# Patient Record
Sex: Female | Born: 1955 | Race: White | Hispanic: No | Marital: Married | Smoking: Never smoker
Health system: Southern US, Community
[De-identification: ages and names within clinical notes are randomized; demographics above are authoritative.]

## PROBLEM LIST (undated history)

## (undated) DIAGNOSIS — F419 Anxiety disorder, unspecified: Secondary | ICD-10-CM

## (undated) DIAGNOSIS — F32A Depression, unspecified: Secondary | ICD-10-CM

## (undated) DIAGNOSIS — I10 Essential (primary) hypertension: Secondary | ICD-10-CM

## (undated) DIAGNOSIS — K219 Gastro-esophageal reflux disease without esophagitis: Secondary | ICD-10-CM

## (undated) DIAGNOSIS — E119 Type 2 diabetes mellitus without complications: Secondary | ICD-10-CM

## (undated) DIAGNOSIS — E7800 Pure hypercholesterolemia, unspecified: Secondary | ICD-10-CM

## (undated) DIAGNOSIS — F329 Major depressive disorder, single episode, unspecified: Secondary | ICD-10-CM

## (undated) DIAGNOSIS — M1990 Unspecified osteoarthritis, unspecified site: Secondary | ICD-10-CM

## (undated) DIAGNOSIS — C50919 Malignant neoplasm of unspecified site of unspecified female breast: Secondary | ICD-10-CM

## (undated) HISTORY — PX: CHOLECYSTECTOMY: SHX55

## (undated) HISTORY — DX: Essential (primary) hypertension: I10

## (undated) HISTORY — DX: Anxiety disorder, unspecified: F41.9

## (undated) HISTORY — PX: CARPAL TUNNEL RELEASE: SHX101

## (undated) HISTORY — PX: ABDOMINAL HYSTERECTOMY: SHX81

## (undated) HISTORY — DX: Type 2 diabetes mellitus without complications: E11.9

## (undated) HISTORY — DX: Gastro-esophageal reflux disease without esophagitis: K21.9

## (undated) HISTORY — DX: Depression, unspecified: F32.A

## (undated) HISTORY — DX: Pure hypercholesterolemia, unspecified: E78.00

## (undated) HISTORY — DX: Major depressive disorder, single episode, unspecified: F32.9

## (undated) HISTORY — DX: Unspecified osteoarthritis, unspecified site: M19.90

---

## 1996-12-23 DIAGNOSIS — C50919 Malignant neoplasm of unspecified site of unspecified female breast: Secondary | ICD-10-CM

## 1996-12-23 HISTORY — PX: BREAST LUMPECTOMY: SHX2

## 1996-12-23 HISTORY — DX: Malignant neoplasm of unspecified site of unspecified female breast: C50.919

## 1998-02-20 ENCOUNTER — Ambulatory Visit (HOSPITAL_COMMUNITY): Admission: RE | Admit: 1998-02-20 | Discharge: 1998-02-20 | Payer: Self-pay | Admitting: *Deleted

## 1998-04-04 ENCOUNTER — Other Ambulatory Visit: Admission: RE | Admit: 1998-04-04 | Discharge: 1998-04-04 | Payer: Self-pay | Admitting: *Deleted

## 1998-09-01 ENCOUNTER — Ambulatory Visit (HOSPITAL_COMMUNITY): Admission: RE | Admit: 1998-09-01 | Discharge: 1998-09-01 | Payer: Self-pay | Admitting: *Deleted

## 1998-12-05 ENCOUNTER — Other Ambulatory Visit: Admission: RE | Admit: 1998-12-05 | Discharge: 1998-12-05 | Payer: Self-pay | Admitting: Obstetrics and Gynecology

## 1999-02-28 ENCOUNTER — Ambulatory Visit (HOSPITAL_COMMUNITY): Admission: RE | Admit: 1999-02-28 | Discharge: 1999-02-28 | Payer: Self-pay | Admitting: *Deleted

## 1999-09-24 ENCOUNTER — Encounter: Payer: Self-pay | Admitting: Obstetrics and Gynecology

## 1999-09-24 ENCOUNTER — Ambulatory Visit (HOSPITAL_COMMUNITY): Admission: RE | Admit: 1999-09-24 | Discharge: 1999-09-24 | Payer: Self-pay | Admitting: Obstetrics and Gynecology

## 2000-03-11 ENCOUNTER — Other Ambulatory Visit: Admission: RE | Admit: 2000-03-11 | Discharge: 2000-03-11 | Payer: Self-pay | Admitting: Obstetrics and Gynecology

## 2000-04-08 ENCOUNTER — Encounter: Admission: RE | Admit: 2000-04-08 | Discharge: 2000-04-08 | Payer: Self-pay | Admitting: *Deleted

## 2000-11-18 ENCOUNTER — Encounter: Payer: Self-pay | Admitting: Obstetrics and Gynecology

## 2000-11-18 ENCOUNTER — Encounter: Admission: RE | Admit: 2000-11-18 | Discharge: 2000-11-18 | Payer: Self-pay | Admitting: Obstetrics and Gynecology

## 2001-04-08 ENCOUNTER — Other Ambulatory Visit: Admission: RE | Admit: 2001-04-08 | Discharge: 2001-04-08 | Payer: Self-pay | Admitting: Obstetrics and Gynecology

## 2001-11-24 ENCOUNTER — Encounter: Admission: RE | Admit: 2001-11-24 | Discharge: 2001-11-24 | Payer: Self-pay | Admitting: *Deleted

## 2002-07-20 ENCOUNTER — Other Ambulatory Visit: Admission: RE | Admit: 2002-07-20 | Discharge: 2002-07-20 | Payer: Self-pay | Admitting: Obstetrics and Gynecology

## 2002-12-13 ENCOUNTER — Encounter: Admission: RE | Admit: 2002-12-13 | Discharge: 2002-12-13 | Payer: Self-pay | Admitting: *Deleted

## 2004-02-02 ENCOUNTER — Encounter: Admission: RE | Admit: 2004-02-02 | Discharge: 2004-02-02 | Payer: Self-pay | Admitting: Obstetrics and Gynecology

## 2004-02-29 ENCOUNTER — Other Ambulatory Visit: Admission: RE | Admit: 2004-02-29 | Discharge: 2004-02-29 | Payer: Self-pay | Admitting: Obstetrics and Gynecology

## 2005-04-11 ENCOUNTER — Encounter: Admission: RE | Admit: 2005-04-11 | Discharge: 2005-04-11 | Payer: Self-pay | Admitting: Obstetrics and Gynecology

## 2005-06-03 ENCOUNTER — Other Ambulatory Visit: Admission: RE | Admit: 2005-06-03 | Discharge: 2005-06-03 | Payer: Self-pay | Admitting: Obstetrics and Gynecology

## 2005-11-08 ENCOUNTER — Ambulatory Visit: Payer: Self-pay | Admitting: Unknown Physician Specialty

## 2006-04-14 ENCOUNTER — Encounter: Admission: RE | Admit: 2006-04-14 | Discharge: 2006-04-14 | Payer: Self-pay | Admitting: *Deleted

## 2007-01-14 ENCOUNTER — Ambulatory Visit: Payer: Self-pay | Admitting: Family Medicine

## 2007-04-20 ENCOUNTER — Ambulatory Visit: Payer: Self-pay | Admitting: Family Medicine

## 2007-04-22 ENCOUNTER — Encounter: Admission: RE | Admit: 2007-04-22 | Discharge: 2007-04-22 | Payer: Self-pay | Admitting: Obstetrics and Gynecology

## 2008-01-28 ENCOUNTER — Encounter (INDEPENDENT_AMBULATORY_CARE_PROVIDER_SITE_OTHER): Payer: Self-pay | Admitting: Obstetrics and Gynecology

## 2008-01-28 ENCOUNTER — Inpatient Hospital Stay (HOSPITAL_COMMUNITY): Admission: RE | Admit: 2008-01-28 | Discharge: 2008-01-30 | Payer: Self-pay | Admitting: Obstetrics and Gynecology

## 2008-05-03 ENCOUNTER — Encounter: Admission: RE | Admit: 2008-05-03 | Discharge: 2008-05-03 | Payer: Self-pay | Admitting: Obstetrics and Gynecology

## 2008-07-27 ENCOUNTER — Ambulatory Visit: Payer: Self-pay | Admitting: Family Medicine

## 2008-10-26 ENCOUNTER — Ambulatory Visit (HOSPITAL_COMMUNITY): Admission: RE | Admit: 2008-10-26 | Discharge: 2008-10-26 | Payer: Self-pay | Admitting: *Deleted

## 2008-10-26 ENCOUNTER — Encounter (INDEPENDENT_AMBULATORY_CARE_PROVIDER_SITE_OTHER): Payer: Self-pay | Admitting: *Deleted

## 2009-05-04 ENCOUNTER — Encounter: Admission: RE | Admit: 2009-05-04 | Discharge: 2009-05-04 | Payer: Self-pay | Admitting: Obstetrics and Gynecology

## 2010-01-12 ENCOUNTER — Emergency Department: Payer: Self-pay | Admitting: Emergency Medicine

## 2010-05-08 ENCOUNTER — Encounter: Admission: RE | Admit: 2010-05-08 | Discharge: 2010-05-08 | Payer: Self-pay | Admitting: Obstetrics and Gynecology

## 2011-05-07 NOTE — Op Note (Signed)
[name], [name]                ACCOUNT NO.:  [id number]   MEDICAL RECORD NO.:  [id number]          PATIENT TYPE:  AMB   LOCATION:  DAY                          FACILITY:  [facility]   PHYSICIAN:  Alfonse Ras, MD   DATE OF BIRTH:  1956/07/05   DATE OF PROCEDURE:  DATE OF DISCHARGE:                               OPERATIVE REPORT   PREOPERATIVE DIAGNOSIS:  Symptomatic cholelithiasis.   POSTOPERATIVE DIAGNOSIS:  Symptomatic cholelithiasis.   PROCEDURE:  Laparoscopic cholecystectomy.   SURGEON:  Alfonse Ras, M.D.   ASSISTANT:  Anselm Pancoast. Zachery Dakins, M.D.   ANESTHESIA:  General.   DESCRIPTION:  The patient was taken to the operating room after informed  consent was granted for laparoscopic cholecystectomy.  She was placed in  supine position and after adequate general anesthesia was induced using  endotracheal tube, time-out was performed with the OR staff.  The  abdomen was prepped and draped in normal sterile fashion.  Using a  transverse infraumbilical incision, I dissect down the fascia.  This was  opened vertically.  An O Vicryl pursestring suture was placed around the  fascial defect.  Hassan trocar was placed in the abdomen and  pneumoperitoneum was obtained.  Under direct vision, an 11-mm trocar was  placed in subxiphoid region.  Two 5-mm trocars were placed in the right  abdomen.  Gallbladder was identified and retracted to cephalad.  The  neck of the gallbladder was retracted laterally and a critical view was  easily obtained of the cystic duct with sharp dissection.  Its junction  with the gallbladder and common duct were easily visualized.  The  patient had normal preoperative liver function tests and no evidence of  the dilated common bile duct, and with this critical view, I opted to  triply clipped and divided the cystic duct.  A critical view was then  obtained of the cystic artery.  It was triply clipped and divided.  Gallbladder was taken off the  gallbladder bed using Bovie  electrocautery, placed in EndoCatch bag and removed the umbilical port.  Right upper quadrant was copiously irrigated.  Adequate hemostasis was  insured.  Pneumoperitoneum was released.  The infraumbilical fascial  defect was closed with the O Vicryl pursestring sutures.  Skin incisions  were closed with subcuticular 4-0 Monocryl.  Steri-Strips and sterile  dressings were applied.  The patient tolerated the procedure well and  went to PACU in good condition.      Alfonse Ras, MD  Electronically Signed     KRE/MEDQ  D:  10/26/2008  T:  10/27/2008  Job:  [id number]

## 2011-05-07 NOTE — H&P (Signed)
Bridget [name], Bridget [name]                ACCOUNT NO.:  [id number]   MEDICAL RECORD NO.:  [id number]          PATIENT TYPE:  AMB   LOCATION:  SDC                           FACILITY:  [facility]   PHYSICIAN:  Duke Salvia. Marcelle Overlie, M.D.DATE OF BIRTH:  11-May-1956   DATE OF ADMISSION:  01/28/2008  DATE OF DISCHARGE:                              HISTORY & PHYSICAL   CHIEF COMPLAINT:  Menorrhagia secondary to leiomyoma.   HISTORY OF PRESENT ILLNESS:  A 55 year old G3, P2, prior tubal who  presents with a history of menorrhagia and increased pelvic pain  secondary to leiomyoma.  She presents at this time for a TAH.  She would  prefer to have her ovaries conserved if they are normal.  The procedure  of hysterectomy including risks related to bleeding, infection,  transfusion, phlebitis, wound infection, adjacent organ injury, her  expected time a recovery all discussed.  Her strong preference is to  conserve her otherwise normal appearing ovaries.  Of note that her  evaluation in the office as included ultrasound dated December 02, 2007,  that showed a large 11.3 x 9.7 cm posterior fibroid, several others in  the 2 cm range.  No free fluid.  Adnexa otherwise were unremarkable.   ALLERGIES:  CODEINE.   CURRENT MEDICATIONS:  1. Baby aspirin once daily.  2. Paxil CR 25 mg daily.  3. Maxzide once daily.   SURGICAL HISTORY:  She has had three prior cesareans.   REVIEW OF SYSTEMS:  Significant for anxiety for which she takes Paxil.   FAMILY HISTORY:  Significant for mother who recently received a stent  after an MI.   Her PCP is Dr. Leim Fabry.  Also of note that she has had DCIS of  the right breast, treated by lumpectomy and radiation therapy with  normal follow-up since that time.   PHYSICAL EXAMINATION:  VITAL SIGNS:  Temperature 98.2, blood pressure  128/74.  HEENT:  Unremarkable.  NECK:  Supple without masses.  LUNGS:  Clear.  CARDIOVASCULAR:  Regular rate and rhythm without murmurs,  rubs or  gallops.  BREASTS:  Without masses.  ABDOMEN:  Soft, flat and nontender.  PELVIC:  Normal external genitalia, vagina and cervix clear.  Uterus was  12-14 weeks' size.  Adnexa unremarkable.  EXTREMITIES:  Unremarkable.  NEUROLOGIC:  Unremarkable.   IMPRESSION:  Symptomatic leiomyoma menorrhagia with pelvic pain.   PLAN:  Total abdominal hysterectomy.  The patient prefers conservation  of her ovaries if normal.  We did discuss also the possibility of USO or  BSO if adhesions or other problems are encountered at the time of the  surgery.  Other procedure and risks reviewed as above.      Richard M. Marcelle Overlie, M.D.  Electronically Signed     RMH/MEDQ  D:  01/20/2008  T:  01/20/2008  Job:  [id number]

## 2011-05-07 NOTE — Op Note (Signed)
Bridget [name], Bridget [name]                ACCOUNT NO.:  [id number]   MEDICAL RECORD NO.:  [id number]          PATIENT TYPE:  AMB   LOCATION:  SDC                           FACILITY:  [facility]   PHYSICIAN:  Duke Salvia. Marcelle Overlie, M.D.DATE OF BIRTH:  06/05/1956   DATE OF PROCEDURE:  01/28/2008  DATE OF DISCHARGE:                               OPERATIVE REPORT   PREOPERATIVE DIAGNOSIS:  Symptomatic leiomyoma.   POSTOPERATIVE DIAGNOSIS:  Symptomatic leiomyoma.   PROCEDURE:  Total abdominal hysterectomy.   SURGEON:  Duke Salvia. Marcelle Overlie, M.D.   ASSISTANT:  Dineen Kid. Rana Snare, M.D.   ANESTHESIA:  General endotracheal anesthesia.   COMPLICATIONS:  None.   DRAINS:  Foley catheter.   BLOOD LOSS:  150-200 mL.   SPECIMENS REMOVED:  Uterus.   PROCEDURE AND FINDINGS:  The patient was taken to the operating room.  After an adequate level of general anesthesia was obtained, the patient  was laid supine, the abdomen, perineum and vagina were prepped and  draped for TAH.  The bladder was drained with a Foley catheter.  The old  scar was excised in an ellipse and carried down to the fascia which was  incised and extended in a transverse manner.  There was significant  scarring noted at the level of the muscle, but the rectus muscles were  dissected free from the fascia and the peritoneum was entered superiorly  without incident.  There were no significant intra-abdominal adhesions.  The uterus was approximately 15 weeks size, was delivered through the  incision, and the initial part of the surgery was performed without a  retractor.  The ovaries on each side were normal and at her request were  conserved.  Long Kelly clamps were then placed at each utero-ovarian  pedicle. Starting on the left, the utero-ovarian pedicle was coagulated  and divided with a LigaSure down to and including the round ligament.  The same was repeated on the opposite side conserving both ovaries with  excellent hemostasis.  Sharp  dissection with Metzenbaum scissors was  then used to dissect the bladder below which had a fair amount of  scarring due to her three previous cesarean.  Care was taken to stay  close to the uterus. Sharp and blunt dissection was used to dissect the  bladder below the cervicovaginal junction.  The LigaSure on each side  was then used to coagulate and divide the ascending branch of the  uterine artery. The fundus was then delivered and retractor was  positioned.  The bowel was packed superiorly out of the field and the  patient placed in a little more Trendelenburg at that point. A tenaculum  was then used to grasp the cervical stump and was placed on traction.  Further inspection revealed the bladder was below the cervicovaginal  junction.  The remaining portion of the cervix was removed with straight  and curved Heaney clamps, staying close to the uterus.  The specimen was  thus removed and the cuff closed with interrupted 2-0 Vicryl sutures.  The pelvis was then irrigated with saline and aspirated and noted be  hemostatic.  Urine was clear at that point.  The bladder was intact and  hemostatic below the area of dissection.  Prior to closure, sponge,  needle and needle counts were reported as correct x2.  The appendix was  inspected and noted to be normal and retrocecal.  The peritoneum was  closed with running 2-0 Vicryl suture, the fascia was closed from  laterally to the midline on either side with a 0 PDS suture.  Prior to  complete fascial closure, the subfascial On-Q catheter was positioned.  After closure of the subcutaneous, the second catheter was positioned.  The subcutaneous tissue was irrigated and made hemostatic with the  Bovie.  Clips used on the skin along with a pressure dressing and the On-  Q catheters were primed at that point.  Clear urine was noted at the end  of the case.  She went to the recovery room in good condition.      Richard M. Marcelle Overlie, M.D.   Electronically Signed     RMH/MEDQ  D:  01/28/2008  T:  01/28/2008  Job:  [id number]

## 2011-05-07 NOTE — Discharge Summary (Signed)
NAMEGIANNIE, SOLIDAY                ACCOUNT NO.:  [id number]   MEDICAL RECORD NO.:  [id number]          PATIENT TYPE:  INP   LOCATION:  9320                          FACILITY:  [facility]   PHYSICIAN:  Duke Salvia. Marcelle Overlie, M.D.DATE OF BIRTH:  08/20/1956   DATE OF ADMISSION:  01/28/2008  DATE OF DISCHARGE:  01/30/2008                               DISCHARGE SUMMARY   DISCHARGE DIAGNOSES:  1. Symptomatic leiomyoma.  2. Total abdominal hysterectomy this admission.   HISTORY OF PRESENT ILLNESS:  Please see admission H&P for details.  Briefly a 55 year old with symptomatic leiomyoma presents for  hysterectomy.   HOSPITAL COURSE:  On February 5, under general anesthesia, the patient  underwent TAH removing a 15-week size uterus.  Ovaries were both normal  and were conserved at her request.  EBL at the time of surgery was 150  mL.  On postoperative day #1, she was afebrile.  Diet was advanced.  Foley catheter was removed.  Hemoglobin was 11.2.  By the second  postoperative day she was afebrile, tolerating a regular diet,  ambulating without difficulty.  She had established normal bowel and  urinary function and was ready for discharge at that point.  The On-Q  catheters were removed.  The incision was clean and dry.  The clips were  left intact.   LABORATORY DATA:  Blood type is A negative.  CMET on admission was  normal.  CBC preoperative; WBC 10.4, hemoglobin 14.2, hematocrit 42.3,  EPT was negative.  Postoperative CBC on February 6; WBC 10.0, hemoglobin  11.2, hematocrit 32.3.   DISPOSITION:  The patient is discharged with the clips intact and will  return to the office in three days to have the clips removed.  Tylox  p.r.n. pain.  Specific instructions regarding diet, sex, and exercise  were given.  Advised to report any fever over 101, increased pain or  bleeding, or urinary complaints.   CONDITION ON DISCHARGE:  Good.   ACTIVITY:  Graded increase.      Richard M. Marcelle Overlie, M.D.  Electronically Signed     RMH/MEDQ  D:  01/30/2008  T:  02/01/2008  Job:  [id number]

## 2011-09-13 LAB — CBC
HCT: 33.3 — ABNORMAL LOW
HCT: 42.3
Hemoglobin: 11.2 — ABNORMAL LOW
MCHC: 33.6
MCHC: 33.7
MCV: 85.8
Platelets: 283
Platelets: 320
RBC: 3.88
RBC: 4.97
RDW: 13.5
RDW: 13.7
WBC: 10.4

## 2011-09-13 LAB — COMPREHENSIVE METABOLIC PANEL
ALT: 30
AST: 25
GFR calc Af Amer: >60
GFR calc non Af Amer: >60
Glucose, Bld: 99

## 2011-09-13 LAB — TYPE AND SCREEN: Antibody Screen: NEGATIVE

## 2011-09-13 LAB — PREGNANCY, URINE: Preg Test, Ur: NEGATIVE

## 2011-09-24 LAB — COMPREHENSIVE METABOLIC PANEL
ALT: 40 — ABNORMAL HIGH
AST: 33
Albumin: 3.8
Alkaline Phosphatase: 70
CO2: 30
Creatinine, Ser: 0.7
Glucose, Bld: 116 — ABNORMAL HIGH
Total Bilirubin: 0.8

## 2011-09-24 LAB — DIFFERENTIAL
Lymphocytes Relative: 28
Monocytes Relative: 7
Neutrophils Relative %: 62

## 2011-09-24 LAB — CBC
HCT: 43.2
Hemoglobin: 14.5
MCHC: 33.6
Platelets: 306

## 2012-09-14 ENCOUNTER — Ambulatory Visit: Payer: Self-pay | Admitting: Gastroenterology

## 2012-09-15 LAB — PATHOLOGY REPORT

## 2013-11-09 ENCOUNTER — Other Ambulatory Visit: Payer: Self-pay

## 2013-11-09 DIAGNOSIS — Z1231 Encounter for screening mammogram for malignant neoplasm of breast: Secondary | ICD-10-CM

## 2013-12-02 ENCOUNTER — Ambulatory Visit
Admission: RE | Admit: 2013-12-02 | Discharge: 2013-12-02 | Disposition: A | Discharge status: Home / Self Care | Payer: BC Managed Care – PPO | Source: Ambulatory Visit

## 2013-12-02 DIAGNOSIS — Z1231 Encounter for screening mammogram for malignant neoplasm of breast: Secondary | ICD-10-CM

## 2014-04-02 DIAGNOSIS — K219 Gastro-esophageal reflux disease without esophagitis: Secondary | ICD-10-CM | POA: Insufficient documentation

## 2014-06-13 DIAGNOSIS — R739 Hyperglycemia, unspecified: Secondary | ICD-10-CM | POA: Insufficient documentation

## 2014-06-30 ENCOUNTER — Ambulatory Visit: Payer: Self-pay | Admitting: Otolaryngology

## 2015-08-09 ENCOUNTER — Other Ambulatory Visit: Payer: Self-pay

## 2015-10-17 ENCOUNTER — Other Ambulatory Visit: Payer: Self-pay

## 2015-10-17 DIAGNOSIS — Z1231 Encounter for screening mammogram for malignant neoplasm of breast: Secondary | ICD-10-CM

## 2015-11-07 ENCOUNTER — Ambulatory Visit
Admission: RE | Admit: 2015-11-07 | Discharge: 2015-11-07 | Disposition: A | Discharge status: Home / Self Care | Payer: BLUE CROSS/BLUE SHIELD | Source: Ambulatory Visit

## 2015-11-07 DIAGNOSIS — Z1231 Encounter for screening mammogram for malignant neoplasm of breast: Secondary | ICD-10-CM

## 2016-04-22 ENCOUNTER — Other Ambulatory Visit: Payer: Self-pay | Admitting: Family Medicine

## 2016-04-22 DIAGNOSIS — M79662 Pain in left lower leg: Secondary | ICD-10-CM

## 2016-04-22 DIAGNOSIS — M7989 Other specified soft tissue disorders: Secondary | ICD-10-CM

## 2016-04-23 ENCOUNTER — Ambulatory Visit
Admission: RE | Admit: 2016-04-23 | Discharge: 2016-04-23 | Disposition: A | Discharge status: Home / Self Care | Payer: BLUE CROSS/BLUE SHIELD | Source: Ambulatory Visit | Attending: Family Medicine | Admitting: Family Medicine

## 2016-04-23 DIAGNOSIS — M79605 Pain in left leg: Secondary | ICD-10-CM | POA: Diagnosis present

## 2016-04-23 DIAGNOSIS — M7989 Other specified soft tissue disorders: Secondary | ICD-10-CM

## 2016-04-23 DIAGNOSIS — M79662 Pain in left lower leg: Secondary | ICD-10-CM

## 2017-04-28 ENCOUNTER — Other Ambulatory Visit: Payer: Self-pay | Admitting: General Surgery

## 2017-06-09 ENCOUNTER — Other Ambulatory Visit: Payer: Self-pay | Admitting: Obstetrics and Gynecology

## 2017-06-09 DIAGNOSIS — Z1231 Encounter for screening mammogram for malignant neoplasm of breast: Secondary | ICD-10-CM

## 2017-06-30 ENCOUNTER — Ambulatory Visit
Admission: RE | Admit: 2017-06-30 | Discharge: 2017-06-30 | Disposition: A | Discharge status: Home / Self Care | Payer: BLUE CROSS/BLUE SHIELD | Source: Ambulatory Visit | Attending: Obstetrics and Gynecology | Admitting: Obstetrics and Gynecology

## 2017-06-30 ENCOUNTER — Encounter (INDEPENDENT_AMBULATORY_CARE_PROVIDER_SITE_OTHER): Payer: Self-pay

## 2017-06-30 DIAGNOSIS — Z1231 Encounter for screening mammogram for malignant neoplasm of breast: Secondary | ICD-10-CM

## 2017-06-30 HISTORY — DX: Malignant neoplasm of unspecified site of unspecified female breast: C50.919

## 2017-07-03 ENCOUNTER — Other Ambulatory Visit: Payer: Self-pay

## 2017-07-03 DIAGNOSIS — Z1231 Encounter for screening mammogram for malignant neoplasm of breast: Secondary | ICD-10-CM

## 2017-07-29 ENCOUNTER — Telehealth: Payer: Self-pay | Admitting: Oncology

## 2017-07-29 NOTE — Telephone Encounter (Signed)
Patient called to reschedule appt . 07/30/17 MF see md ref Astrid Divine leukocytosis (mailed new pt packet, notes in book office notified)  TT

## 2017-08-04 ENCOUNTER — Inpatient Hospital Stay: Payer: BLUE CROSS/BLUE SHIELD | Admitting: Oncology

## 2017-08-17 DIAGNOSIS — D72829 Elevated white blood cell count, unspecified: Secondary | ICD-10-CM | POA: Insufficient documentation

## 2017-08-18 ENCOUNTER — Encounter: Payer: Self-pay | Admitting: Oncology

## 2017-08-18 ENCOUNTER — Inpatient Hospital Stay: Payer: 59 | Attending: Oncology | Admitting: Oncology

## 2017-08-18 ENCOUNTER — Inpatient Hospital Stay: Payer: 59

## 2017-08-18 DIAGNOSIS — D72829 Elevated white blood cell count, unspecified: Secondary | ICD-10-CM | POA: Insufficient documentation

## 2017-08-18 DIAGNOSIS — Z808 Family history of malignant neoplasm of other organs or systems: Secondary | ICD-10-CM | POA: Insufficient documentation

## 2017-08-18 DIAGNOSIS — Z79899 Other long term (current) drug therapy: Secondary | ICD-10-CM | POA: Insufficient documentation

## 2017-08-18 DIAGNOSIS — R531 Weakness: Secondary | ICD-10-CM | POA: Diagnosis not present

## 2017-08-18 DIAGNOSIS — Z853 Personal history of malignant neoplasm of breast: Secondary | ICD-10-CM

## 2017-08-18 DIAGNOSIS — Z7984 Long term (current) use of oral hypoglycemic drugs: Secondary | ICD-10-CM | POA: Diagnosis not present

## 2017-08-18 DIAGNOSIS — Z8042 Family history of malignant neoplasm of prostate: Secondary | ICD-10-CM | POA: Insufficient documentation

## 2017-08-18 DIAGNOSIS — Z7982 Long term (current) use of aspirin: Secondary | ICD-10-CM | POA: Diagnosis not present

## 2017-08-18 LAB — CBC WITH DIFFERENTIAL/PLATELET
Basophils Absolute: 0.1 10*3/uL (ref 0–0.1)
Basophils Relative: 1 %
Eosinophils Absolute: 0.3 10*3/uL (ref 0–0.7)
Eosinophils Relative: 3 %
HCT: 40.3 % (ref 35.0–47.0)
Hemoglobin: 13.8 g/dL (ref 12.0–16.0)
Lymphocytes Relative: 25 %
Lymphs Abs: 2.5 10*3/uL (ref 1.0–3.6)
MCH: 29 pg (ref 26.0–34.0)
MCHC: 34.3 g/dL (ref 32.0–36.0)
MCV: 84.5 fL (ref 80.0–100.0)
Monocytes Absolute: 0.7 10*3/uL (ref 0.2–0.9)
Monocytes Relative: 8 %
Neutro Abs: 6.2 10*3/uL (ref 1.4–6.5)
Neutrophils Relative %: 63 %
Platelets: 335 10*3/uL (ref 150–440)
RBC: 4.77 MIL/uL (ref 3.80–5.20)
RDW: 13.1 % (ref 11.5–14.5)
WBC: 9.7 10*3/uL (ref 3.6–11.0)

## 2017-08-18 NOTE — Progress Notes (Signed)
Patient here today as a new patient for Leukocytosis

## 2017-08-18 NOTE — Progress Notes (Signed)
Hematology/Oncology Consult note [facility] Telephone:([phone] [phone]  CONSULT NOTE Patient Care Team: Leim Fabry, MD as PCP - General (Family Medicine)  CHIEF COMPLAINTS/PURPOSE OF CONSULTATION:   High wbc  HISTORY OF PRESENTING ILLNESS:  Bridget [name] 61 y.o.  female  With past medical history listed as below who was referred by Dr. Dayna Barker  To me for evaluation and management of elevated white blood cell count  patient recalled having upper respiratory infections in June/July this year and had a short course of steroids  Patient reports that she feels well at her baseline except mild fatigue.  She denies smoking. Occasional alcohol use.  Denies any weight loss, lack of appetite, chest pain, shortness of breath, lower extremity swelling ROS:  Review of Systems  Constitutional: Positive for fatigue.  HENT:  Negative.   Eyes: Negative.   Respiratory: Negative.   Cardiovascular: Negative.   Gastrointestinal: Negative.   Endocrine: Negative.   Genitourinary: Negative.    Musculoskeletal: Negative.   Skin: Negative.   Neurological: Negative.   Hematological: Negative.   Psychiatric/Behavioral: Negative.     MEDICAL HISTORY:  Past Medical History:  Diagnosis Date  . Breast cancer (HCC) 1998   Right Breast    SURGICAL HISTORY: Past Surgical History:  Procedure Laterality Date  . BREAST LUMPECTOMY Right 1998    SOCIAL HISTORY: Social History   Social History  . Marital status: Married    Spouse name: N/A  . Number of children: N/A  . Years of education: N/A   Occupational History  . Not on file.   Social History Main Topics  . Smoking status: Not on file  . Smokeless tobacco: Not on file  . Alcohol use Not on file  . Drug use: Unknown  . Sexual activity: Not on file   Other Topics Concern  . Not on file   Social History Narrative  . No narrative on file    FAMILY HISTORY: Family History  Problem  Relation Age of Onset  . Heart disease Mother   . Hypertension Mother   . Vascular Disease Mother   . Macular degeneration Mother   . COPD Mother   . Prostate cancer Father   . Heart disease Father   . Multiple myeloma Paternal Aunt   . Cancer Maternal Grandfather     ALLERGIES:  is allergic to codeine and latex.  MEDICATIONS:  Current Outpatient Prescriptions  Medication Sig Dispense Refill  . aspirin 81 MG chewable tablet Chew 81 mg by mouth daily.    Marland Kitchen buPROPion (WELLBUTRIN SR) 150 MG 12 hr tablet Take 150 mg by mouth daily.    . clonazePAM (KLONOPIN) 0.5 MG tablet Take 0.5 mg by mouth as needed.    . hydrochlorothiazide (MICROZIDE) 12.5 MG capsule Take 12.5 mg by mouth.    . metFORMIN (GLUCOPHAGE-XR) 500 MG 24 hr tablet Take 500 mg by mouth daily.    . naproxen sodium (ANAPROX) 220 MG tablet Take 220 mg by mouth daily as needed.    Marland Kitchen omeprazole (PRILOSEC) 20 MG capsule Take 20 mg by mouth as needed.    Marland Kitchen PARoxetine (PAXIL) 40 MG tablet Take 40 mg by mouth daily.     No current facility-administered medications for this visit.       Marland Kitchen  PHYSICAL EXAMINATION: ECOG PERFORMANCE STATUS: 0 - Asymptomatic Vitals:   08/18/17 1422  BP: (!) 141/84  Pulse: 69  Resp: 16  Temp: (!) 96.9 F (36.1 C)   Filed  Weights   08/18/17 1422  Weight: 188 lb 6 oz (85.4 kg)    GENERAL: Well-nourished well-developed; Alert, no distress and comfortable.  EYES: no pallor or icterus OROPHARYNX: no thrush or ulceration; good dentition  NECK: supple, no masses felt LYMPH:  no palpable lymphadenopathy in the cervical, axillary or inguinal regions LUNGS: clear to auscultation and  No wheeze or crackles HEART/CVS: regular rate & rhythm and no murmurs; No lower extremity edema ABDOMEN: abdomen soft, non-tender and normal bowel sounds Musculoskeletal:no cyanosis of digits and no clubbing  PSYCH: alert & oriented x 3  NEURO: no focal motor/sensory deficits SKIN:  no rashes or significant  lesions  LABORATORY DATA:  I have reviewed the data as listed Lab Results  Component Value Date   WBC 9.1 10/26/2008   HGB 14.5 10/26/2008   HCT 43.2 10/26/2008   MCV 86.5 10/26/2008   PLT 306 10/26/2008   No results for input(s): NA, K, CL, CO2, GLUCOSE, BUN, CREATININE, CALCIUM, GFRNONAA, GFRAA, PROT, ALBUMIN, AST, ALT, ALKPHOS, BILITOT, BILIDIR, IBILI in the last 8760 hours.   RADIOGRAPHIC STUDIES: I have personally reviewed the radiological images as listed and agreed with the findings in the report. No results found. Patient does not have recent labs done with [facility].  Her labs from [place] system was reviewed.  01/14/2017  WBC 11.8, hemoglobin 14.3, platelet 341, normal differential except a mildly elevated monocyte  07/14/2017 WBC 11, hemoglobin 13.7, platelet 319,  Normal differential.  in the past from  2015 to  2017, she has had mildly elevated WBC ranges from 10.1-10.2 with normal differential ASSESSMENT & PLAN:  1. Leukocytosis, unspecified type    Discussed with patient that leukocytosis can be a result of many conditions including reactive from benign etiologies as well as clonal process. Given that her counts are only mildly elevated, we will repeated today.   If needed we will add lab encounter for her to  Have additional lab tests done if today's counts are abnormal. All questions were answered. The patient knows to call the clinic with any problems questions or concerns.  Return of visit:  One week Thank you for this kind referral and the opportunity to participate in the care of this patient. A copy of today's note is routed to referring provider Dr.Aldridge   Earlie Server, MD, PhD Hematology Oncology [facility] at [name] Pager- [phone] 08/18/2017

## 2017-08-27 ENCOUNTER — Inpatient Hospital Stay: Payer: 59 | Attending: Oncology | Admitting: Oncology

## 2017-08-27 ENCOUNTER — Encounter: Payer: Self-pay | Admitting: Oncology

## 2017-08-27 VITALS — BP 133/80 | HR 60 | Temp 95.8°F

## 2017-08-27 DIAGNOSIS — Z809 Family history of malignant neoplasm, unspecified: Secondary | ICD-10-CM

## 2017-08-27 DIAGNOSIS — Z7982 Long term (current) use of aspirin: Secondary | ICD-10-CM | POA: Diagnosis not present

## 2017-08-27 DIAGNOSIS — Z8042 Family history of malignant neoplasm of prostate: Secondary | ICD-10-CM | POA: Diagnosis not present

## 2017-08-27 DIAGNOSIS — F419 Anxiety disorder, unspecified: Secondary | ICD-10-CM | POA: Diagnosis not present

## 2017-08-27 DIAGNOSIS — Z79899 Other long term (current) drug therapy: Secondary | ICD-10-CM | POA: Diagnosis not present

## 2017-08-27 DIAGNOSIS — M129 Arthropathy, unspecified: Secondary | ICD-10-CM | POA: Diagnosis not present

## 2017-08-27 DIAGNOSIS — D72829 Elevated white blood cell count, unspecified: Secondary | ICD-10-CM | POA: Diagnosis present

## 2017-08-27 DIAGNOSIS — R5383 Other fatigue: Secondary | ICD-10-CM | POA: Insufficient documentation

## 2017-08-27 DIAGNOSIS — K219 Gastro-esophageal reflux disease without esophagitis: Secondary | ICD-10-CM | POA: Diagnosis not present

## 2017-08-27 DIAGNOSIS — F329 Major depressive disorder, single episode, unspecified: Secondary | ICD-10-CM

## 2017-08-27 DIAGNOSIS — Z9049 Acquired absence of other specified parts of digestive tract: Secondary | ICD-10-CM | POA: Diagnosis not present

## 2017-08-27 NOTE — Progress Notes (Signed)
Patient here today for follow up.  Patient states no new concerns today  

## 2017-08-27 NOTE — Progress Notes (Signed)
Hematology/Oncology Follow Up notes [facility] Telephone:([phone] [phone]  CONSULT NOTE Patient Care Team: Bridget Curry, MD as PCP - General (Family Medicine)  CHIEF COMPLAINTS/PURPOSE OF CONSULTATION:   High wbc  HISTORY OF PRESENTING ILLNESS:  Bridget [name] 61 y.o.  female  With past medical history listed as below who was referred by Bridget [name]  To me for evaluation and management of elevated white blood cell count  patient recalled having upper respiratory infections in June/July this year and had a short course of steroids  Patient reports that she feels well at her baseline except mild fatigue.  She denies smoking. Occasional alcohol use.  Denies any weight loss, lack of appetite, chest pain, shortness of breath, lower extremity swelling  INTERVAL HISTORY Patient presents to discuss about the results. No new complaints.   ROS:  Review of Systems  Constitutional: Positive for fatigue.  HENT:  Negative.   Eyes: Negative.   Respiratory: Negative.   Cardiovascular: Negative.   Gastrointestinal: Negative.   Endocrine: Negative.   Genitourinary: Negative.    Musculoskeletal: Negative.   Skin: Negative.   Neurological: Negative.   Hematological: Negative.   Psychiatric/Behavioral: Negative.     MEDICAL HISTORY:  Past Medical History:  Diagnosis Date  . Anxiety   . Arthritis   . Breast cancer ([place]) 1998   Right Breast  . Depression   . GERD (gastroesophageal reflux disease)     SURGICAL HISTORY: Past Surgical History:  Procedure Laterality Date  . ABDOMINAL HYSTERECTOMY    . BREAST LUMPECTOMY Right 1998  . CHOLECYSTECTOMY      SOCIAL HISTORY: Social History   Social History  . Marital status: Married    Spouse name: N/A  . Number of children: N/A  . Years of education: N/A   Occupational History  . Not on file.   Social History Main Topics  . Smoking status: Never Smoker  . Smokeless tobacco: Never  Used  . Alcohol use No  . Drug use: No  . Sexual activity: Not on file   Other Topics Concern  . Not on file   Social History Narrative  . No narrative on file    FAMILY HISTORY: Family History  Problem Relation Age of Onset  . Heart disease Mother   . Hypertension Mother   . Vascular Disease Mother   . Macular degeneration Mother   . COPD Mother   . Prostate cancer Father   . Heart disease Father   . Multiple myeloma Paternal Aunt   . Cancer Maternal Grandfather     ALLERGIES:  is allergic to codeine and latex.  MEDICATIONS:  Current Outpatient Prescriptions  Medication Sig Dispense Refill  . aspirin 81 MG chewable tablet Chew 81 mg by mouth daily.    Marland Kitchen buPROPion (WELLBUTRIN SR) 150 MG 12 hr tablet Take 150 mg by mouth daily.    . clonazePAM (KLONOPIN) 0.5 MG tablet Take 0.5 mg by mouth as needed.    . hydrochlorothiazide (MICROZIDE) 12.5 MG capsule Take 12.5 mg by mouth.    . metFORMIN (GLUCOPHAGE-XR) 500 MG 24 hr tablet Take 500 mg by mouth daily.    . naproxen sodium (ANAPROX) 220 MG tablet Take 220 mg by mouth daily as needed.    Marland Kitchen omeprazole (PRILOSEC) 20 MG capsule Take 20 mg by mouth as needed.    Marland Kitchen PARoxetine (PAXIL) 40 MG tablet Take 40 mg by mouth daily.     No current facility-administered medications for this  visit.       Marland Kitchen  PHYSICAL EXAMINATION: ECOG PERFORMANCE STATUS: 0 - Asymptomatic Vitals:   08/27/17 0843  BP: 133/80  Pulse: 60  Temp: (!) 95.8 F (35.4 C)   There were no vitals filed for this visit.  GENERAL: Well-nourished well-developed; Alert, no distress and comfortable.  EYES: no pallor or icterus OROPHARYNX: no thrush or ulceration; good dentition  NECK: supple, no masses felt LYMPH:  no palpable lymphadenopathy in the cervical, axillary or inguinal regions LUNGS: clear to auscultation and  No wheeze or crackles HEART/CVS: regular rate & rhythm and no murmurs; No lower extremity edema ABDOMEN: abdomen soft, non-tender and normal  bowel sounds Musculoskeletal:no cyanosis of digits and no clubbing  PSYCH: alert & oriented x 3  NEURO: no focal motor/sensory deficits SKIN:  no rashes or significant lesions  LABORATORY DATA:  I have reviewed the data as listed Lab Results  Component Value Date   WBC 9.7 08/18/2017   HGB 13.8 08/18/2017   HCT 40.3 08/18/2017   MCV 84.5 08/18/2017   PLT 335 08/18/2017   No results for input(s): NA, K, CL, CO2, GLUCOSE, BUN, CREATININE, CALCIUM, GFRNONAA, GFRAA, PROT, ALBUMIN, AST, ALT, ALKPHOS, BILITOT, BILIDIR, IBILI in the last 8760 hours.   RADIOGRAPHIC STUDIES: I have personally reviewed the radiological images as listed and agreed with the findings in the report. No results found. Patient does not have recent labs done with [facility].  Her labs from [place] system was reviewed.  01/14/2017  WBC 11.8, hemoglobin 14.3, platelet 341, normal differential except a mildly elevated monocyte  07/14/2017 WBC 11, hemoglobin 13.7, platelet 319,  Normal differential.  in the past from  2015 to  2017, she has had mildly elevated WBC ranges from 10.1-10.2 with normal differential ASSESSMENT & PLAN:  1. Leukocytosis, unspecified type    Discussed with patient that leukocytosis can be a result of many conditions including reactive from benign etiologies as well as clonal process.Given the fact that her counts normalized, likely reactive. I hold additional lab tests. We can monitor her counts.   All questions were answered. The patient knows to call the clinic with any problems questions or concerns.  Return of visit:  3 months with repeat CBC.  Thank you for this kind referral and the opportunity to participate in the care of this patient. A copy of today's note is routed to referring provider Bridget [name]   Bridget Server, MD, PhD Hematology Oncology [facility] at [facility] Pager- [phone] 08/27/2017

## 2017-11-24 NOTE — Progress Notes (Signed)
Hematology/Oncology Follow Up notes [facility] Telephone:([phone] [phone]  CONSULT NOTE Patient Care Team: Gayland Curry, MD as PCP - General (Family Medicine)  REASON FOR VISIT Follow up for management  of leukocytosis   HISTORY OF PRESENTING ILLNESS:  Bridget [name] 61 y.o.  female  With past medical history listed as below who was referred by Dr. Astrid Divine  To me for evaluation and management of elevated white blood cell count  patient recalled having upper respiratory infections in June/July this year and had a short course of steroids  Patient reports that she feels well at her baseline except mild fatigue.  She denies smoking. Occasional alcohol use.  Denies any weight loss, lack of appetite, chest pain, shortness of breath, lower extremity swelling  INTERVAL HISTORY Patient presents to follow up for management  of leukocytosis. She has no new complaints. Doing well.   ROS:  Review of Systems  Constitutional: Negative for appetite change, chills and fatigue.  HENT:   Negative for hearing loss.   Eyes: Negative for eye problems.  Respiratory: Negative for chest tightness.   Cardiovascular: Negative for chest pain.  Gastrointestinal: Negative for abdominal distention.  Endocrine: Negative for hot flashes.  Genitourinary: Negative for difficulty urinating.   Musculoskeletal: Negative for arthralgias.  Skin: Negative for itching.  Neurological: Negative for dizziness.  Hematological: Negative for adenopathy.  Psychiatric/Behavioral: The patient is not nervous/anxious.     MEDICAL HISTORY:  Past Medical History:  Diagnosis Date  . Anxiety   . Arthritis   . Breast cancer ([place]) 1998   Right Breast  . Depression   . GERD (gastroesophageal reflux disease)     SURGICAL HISTORY: Past Surgical History:  Procedure Laterality Date  . ABDOMINAL HYSTERECTOMY    . BREAST LUMPECTOMY Right 1998  . CHOLECYSTECTOMY      SOCIAL  HISTORY: Social History   Socioeconomic History  . Marital status: Married    Spouse name: Not on file  . Number of children: Not on file  . Years of education: Not on file  . Highest education level: Not on file  Social Needs  . Financial resource strain: Not on file  . Food insecurity - worry: Not on file  . Food insecurity - inability: Not on file  . Transportation needs - medical: Not on file  . Transportation needs - non-medical: Not on file  Occupational History  . Not on file  Tobacco Use  . Smoking status: Never Smoker  . Smokeless tobacco: Never Used  Substance and Sexual Activity  . Alcohol use: No  . Drug use: No  . Sexual activity: Not on file  Other Topics Concern  . Not on file  Social History Narrative  . Not on file    FAMILY HISTORY: Family History  Problem Relation Age of Onset  . Heart disease Mother   . Hypertension Mother   . Vascular Disease Mother   . Macular degeneration Mother   . COPD Mother   . Prostate cancer Father   . Heart disease Father   . Multiple myeloma Paternal Aunt   . Cancer Maternal Grandfather     ALLERGIES:  is allergic to codeine and latex.  MEDICATIONS:  Current Outpatient Medications  Medication Sig Dispense Refill  . aspirin 81 MG chewable tablet Chew 81 mg by mouth daily.    Marland Kitchen buPROPion (WELLBUTRIN SR) 150 MG 12 hr tablet Take 150 mg by mouth daily.    . clonazePAM (KLONOPIN) 0.5 MG  tablet Take 0.5 mg by mouth as needed.    . hydrochlorothiazide (MICROZIDE) 12.5 MG capsule Take 12.5 mg by mouth every other day.     . metFORMIN (GLUCOPHAGE-XR) 500 MG 24 hr tablet Take 1,000 mg by mouth daily.     . naproxen sodium (ANAPROX) 220 MG tablet Take 220 mg by mouth daily as needed.    Marland Kitchen omeprazole (PRILOSEC) 20 MG capsule Take 20 mg by mouth every 3 (three) days.     Marland Kitchen PARoxetine (PAXIL) 40 MG tablet Take 40 mg by mouth daily.     No current facility-administered medications for this visit.       Marland Kitchen  PHYSICAL  EXAMINATION: ECOG PERFORMANCE STATUS: 0 - Asymptomatic Vitals:   11/26/17 0904  BP: (!) 142/83  Pulse: 64  Resp: 18  Temp: (!) 97.2 F (36.2 C)   Filed Weights   11/26/17 0904  Weight: 185 lb (83.9 kg)    Physical Exam  Constitutional: She is oriented to person, place, and time and well-developed, well-nourished, and in no distress. No distress.  HENT:  Head: Normocephalic and atraumatic.  Eyes: Conjunctivae and EOM are normal. Pupils are equal, round, and reactive to light.  Neck: Neck supple.  Cardiovascular: Normal rate and regular rhythm.  Pulmonary/Chest: Effort normal and breath sounds normal. No respiratory distress.  Abdominal: Soft. Bowel sounds are normal. She exhibits no distension.  Musculoskeletal: Normal range of motion.  Neurological: She is alert and oriented to person, place, and time.  Skin: Skin is warm and dry.  Psychiatric: Affect normal.   LABORATORY DATA:  I have reviewed the data as listed  Her labs from [facility] system was reviewed.  01/14/2017  WBC 11.8, hemoglobin 14.3, platelet 341, normal differential except a mildly elevated monocyte  07/14/2017 WBC 11, hemoglobin 13.7, platelet 319,  Normal differential.  in the past from  2015 to  2017, she has had mildly elevated WBC ranges from 10.1-10.2 with normal differential  CBC Latest Ref Rng & Units 11/26/2017 08/18/2017 10/26/2008  WBC 3.6 - 11.0 K/uL 8.7 9.7 9.1  Hemoglobin 12.0 - 16.0 g/dL 14.3 13.8 14.5  Hematocrit 35.0 - 47.0 % 43.3 40.3 43.2  Platelets 150 - 440 K/uL 332 335 306   ASSESSMENT & PLAN:  61 yo female presents for follow up management for leukocytosis 1. Leukocytosis, unspecified type    Leukocytosis resolved. Likely reactive process.  Hold additional work up at this point. She can follow up with PCP and if any further concerns, she will make follow up appointment with me. Patient agrees with plan.   All questions were answered. The patient knows to call the clinic with any  problems questions or concerns.  Return of visit: as needed.    Earlie Server, MD, PhD Hematology Oncology [facility] at [facility] Pager- [phone] 11/26/2017

## 2017-11-26 ENCOUNTER — Inpatient Hospital Stay (HOSPITAL_BASED_OUTPATIENT_CLINIC_OR_DEPARTMENT_OTHER): Payer: 59 | Admitting: Oncology

## 2017-11-26 ENCOUNTER — Inpatient Hospital Stay: Payer: 59 | Attending: Oncology

## 2017-11-26 ENCOUNTER — Encounter: Payer: Self-pay | Admitting: Oncology

## 2017-11-26 VITALS — BP 142/83 | HR 64 | Temp 97.2°F | Resp 18 | Wt 185.0 lb

## 2017-11-26 DIAGNOSIS — K219 Gastro-esophageal reflux disease without esophagitis: Secondary | ICD-10-CM | POA: Insufficient documentation

## 2017-11-26 DIAGNOSIS — Z7982 Long term (current) use of aspirin: Secondary | ICD-10-CM

## 2017-11-26 DIAGNOSIS — Z7984 Long term (current) use of oral hypoglycemic drugs: Secondary | ICD-10-CM

## 2017-11-26 DIAGNOSIS — Z8041 Family history of malignant neoplasm of ovary: Secondary | ICD-10-CM

## 2017-11-26 DIAGNOSIS — R5383 Other fatigue: Secondary | ICD-10-CM | POA: Insufficient documentation

## 2017-11-26 DIAGNOSIS — F329 Major depressive disorder, single episode, unspecified: Secondary | ICD-10-CM

## 2017-11-26 DIAGNOSIS — D72829 Elevated white blood cell count, unspecified: Secondary | ICD-10-CM | POA: Diagnosis not present

## 2017-11-26 DIAGNOSIS — F419 Anxiety disorder, unspecified: Secondary | ICD-10-CM | POA: Diagnosis not present

## 2017-11-26 DIAGNOSIS — Z808 Family history of malignant neoplasm of other organs or systems: Secondary | ICD-10-CM

## 2017-11-26 LAB — CBC WITH DIFFERENTIAL/PLATELET
Basophils Absolute: 0.1 10*3/uL (ref 0–0.1)
Basophils Relative: 1 %
EOS PCT: 4 %
Eosinophils Absolute: 0.3 10*3/uL (ref 0–0.7)
HCT: 43.3 % (ref 35.0–47.0)
Hemoglobin: 14.3 g/dL (ref 12.0–16.0)
LYMPHS ABS: 2.3 10*3/uL (ref 1.0–3.6)
LYMPHS PCT: 27 %
MCH: 28.9 pg (ref 26.0–34.0)
MCHC: 33.1 g/dL (ref 32.0–36.0)
MCV: 87.2 fL (ref 80.0–100.0)
MONO ABS: 0.7 10*3/uL (ref 0.2–0.9)
MONOS PCT: 8 %
Neutro Abs: 5.3 10*3/uL (ref 1.4–6.5)
Neutrophils Relative %: 60 %
PLATELETS: 332 10*3/uL (ref 150–440)
RBC: 4.96 MIL/uL (ref 3.80–5.20)
RDW: 13 % (ref 11.5–14.5)
WBC: 8.7 10*3/uL (ref 3.6–11.0)

## 2017-11-26 NOTE — Progress Notes (Signed)
Patient here for follow up with labs. She states that she is feeling well today and denies having any pain.

## 2019-01-12 ENCOUNTER — Other Ambulatory Visit: Payer: Self-pay | Admitting: Obstetrics and Gynecology

## 2019-01-12 DIAGNOSIS — Z1231 Encounter for screening mammogram for malignant neoplasm of breast: Secondary | ICD-10-CM

## 2019-01-14 ENCOUNTER — Ambulatory Visit
Admission: RE | Admit: 2019-01-14 | Discharge: 2019-01-14 | Disposition: A | Discharge status: Home / Self Care | Payer: 59 | Source: Ambulatory Visit | Attending: Obstetrics and Gynecology | Admitting: Obstetrics and Gynecology

## 2019-01-14 DIAGNOSIS — Z1231 Encounter for screening mammogram for malignant neoplasm of breast: Secondary | ICD-10-CM

## 2019-01-27 DIAGNOSIS — E7800 Pure hypercholesterolemia, unspecified: Secondary | ICD-10-CM | POA: Insufficient documentation

## 2019-12-02 ENCOUNTER — Other Ambulatory Visit: Payer: Self-pay

## 2019-12-02 DIAGNOSIS — Z20822 Contact with and (suspected) exposure to covid-19: Secondary | ICD-10-CM

## 2019-12-04 LAB — NOVEL CORONAVIRUS, NAA: SARS-CoV-2, NAA: DETECTED — AB

## 2020-01-14 ENCOUNTER — Other Ambulatory Visit: Payer: Self-pay | Admitting: Family Medicine

## 2020-01-14 DIAGNOSIS — Z1231 Encounter for screening mammogram for malignant neoplasm of breast: Secondary | ICD-10-CM

## 2020-02-17 ENCOUNTER — Other Ambulatory Visit: Payer: Self-pay

## 2020-02-17 ENCOUNTER — Ambulatory Visit
Admission: RE | Admit: 2020-02-17 | Discharge: 2020-02-17 | Disposition: A | Discharge status: Home / Self Care | Payer: 59 | Source: Ambulatory Visit | Attending: Family Medicine | Admitting: Family Medicine

## 2020-02-17 DIAGNOSIS — Z1231 Encounter for screening mammogram for malignant neoplasm of breast: Secondary | ICD-10-CM

## 2020-06-19 ENCOUNTER — Other Ambulatory Visit: Payer: Self-pay

## 2020-06-19 ENCOUNTER — Emergency Department: Payer: 59

## 2020-06-19 ENCOUNTER — Encounter: Payer: Self-pay | Admitting: Emergency Medicine

## 2020-06-19 ENCOUNTER — Emergency Department
Admission: EM | Admit: 2020-06-19 | Discharge: 2020-06-19 | Disposition: A | Discharge status: Home / Self Care | Payer: 59 | Attending: Emergency Medicine | Admitting: Emergency Medicine

## 2020-06-19 DIAGNOSIS — M542 Cervicalgia: Secondary | ICD-10-CM | POA: Insufficient documentation

## 2020-06-19 DIAGNOSIS — W109XXA Fall (on) (from) unspecified stairs and steps, initial encounter: Secondary | ICD-10-CM | POA: Diagnosis not present

## 2020-06-19 DIAGNOSIS — Y939 Activity, unspecified: Secondary | ICD-10-CM | POA: Insufficient documentation

## 2020-06-19 DIAGNOSIS — Z91040 Latex allergy status: Secondary | ICD-10-CM | POA: Insufficient documentation

## 2020-06-19 DIAGNOSIS — Y929 Unspecified place or not applicable: Secondary | ICD-10-CM | POA: Diagnosis not present

## 2020-06-19 DIAGNOSIS — Z853 Personal history of malignant neoplasm of breast: Secondary | ICD-10-CM | POA: Insufficient documentation

## 2020-06-19 DIAGNOSIS — S060X1A Concussion with loss of consciousness of 30 minutes or less, initial encounter: Secondary | ICD-10-CM | POA: Insufficient documentation

## 2020-06-19 DIAGNOSIS — Y999 Unspecified external cause status: Secondary | ICD-10-CM | POA: Insufficient documentation

## 2020-06-19 DIAGNOSIS — S0990XA Unspecified injury of head, initial encounter: Secondary | ICD-10-CM | POA: Diagnosis present

## 2020-06-19 DIAGNOSIS — S060X0A Concussion without loss of consciousness, initial encounter: Secondary | ICD-10-CM

## 2020-06-19 NOTE — ED Notes (Signed)
Pt presents to ED via wheelchair from [facility]. Pt A&O x4, pt states was on a step on Saturday night, lost balance and fell backwards landing flat on her back. Pt states felt increased fatigue yesterday and slept most of the day. Pt c/o continued HA since the fall. Pt A&O x4. NAD noted at this time.

## 2020-06-19 NOTE — ED Provider Notes (Signed)
[facility] Emergency Department Provider Note  ____________________________________________   First MD Initiated Contact with Patient 06/19/20 1834     (approximate)  I have reviewed the triage vital signs and the nursing notes.   HISTORY  Chief Complaint Headache    HPI Bridget [name] is a 64 y.o. female  With PMHx depression, GERD, anxiety here with headache. Pt was camping this weekend when she fell while walking downstairs. She turned and landed directly on her head and back. Reports no LOC but experienced acute onset of aching, posterior HA that has persisted. She has had ongoing headache and mild paraspinal neck pain since then. No focal numbness, weakness, dizziness. No tingling of UE. No LOC. She is not on blood thinners. No specific alleviating or aggravating factors.       Past Medical History:  Diagnosis Date  . Anxiety   . Arthritis   . Breast cancer ([place]) 1998   Right Breast  . Depression   . GERD (gastroesophageal reflux disease)     Patient Active Problem List   Diagnosis Date Noted  . Leukocytosis 08/17/2017    Past Surgical History:  Procedure Laterality Date  . ABDOMINAL HYSTERECTOMY    . BREAST LUMPECTOMY Right 1998  . CARPAL TUNNEL RELEASE Left   . CHOLECYSTECTOMY      Prior to Admission medications   Medication Sig Start Date End Date Taking? Authorizing Provider  aspirin 81 MG chewable tablet Chew 81 mg by mouth daily.    [provider]  buPROPion (WELLBUTRIN SR) 150 MG 12 hr tablet Take 150 mg by mouth daily. 07/23/17 07/23/18  [provider]  clonazePAM (KLONOPIN) 0.5 MG tablet Take 0.5 mg by mouth as needed. 01/21/17   [provider]  hydrochlorothiazide (MICROZIDE) 12.5 MG capsule Take 12.5 mg by mouth every other day.  07/23/17   [provider]  metFORMIN (GLUCOPHAGE-XR) 500 MG 24 hr tablet Take 1,000 mg by mouth daily.  07/23/17 07/23/18  [provider]  naproxen sodium  (ANAPROX) 220 MG tablet Take 220 mg by mouth daily as needed.    [provider]  omeprazole (PRILOSEC) 20 MG capsule Take 20 mg by mouth every 3 (three) days.     [provider]  PARoxetine (PAXIL) 40 MG tablet Take 40 mg by mouth daily. 07/23/17 04/19/18  [provider]    Allergies Codeine and Latex  Family History  Problem Relation Age of Onset  . Heart disease Mother   . Hypertension Mother   . Vascular Disease Mother   . Macular degeneration Mother   . COPD Mother   . Prostate cancer Father   . Heart disease Father   . Multiple myeloma Paternal Aunt   . Cancer Maternal Grandfather     Social History Social History   Tobacco Use  . Smoking status: Never Smoker  . Smokeless tobacco: Never Used  Vaping Use  . Vaping Use: Never used  Substance Use Topics  . Alcohol use: No  . Drug use: No    Review of Systems  Review of Systems  Constitutional: Negative for fatigue and fever.  HENT: Negative for congestion and sore throat.   Eyes: Negative for visual disturbance.  Respiratory: Negative for cough and shortness of breath.   Cardiovascular: Negative for chest pain.  Gastrointestinal: Negative for abdominal pain, diarrhea, nausea and vomiting.  Genitourinary: Negative for flank pain.  Musculoskeletal: Positive for neck pain. Negative for back pain.  Skin: Negative for  rash and wound.  Neurological: Positive for headaches. Negative for weakness.  All other systems reviewed and are negative.    ____________________________________________  PHYSICAL EXAM:      VITAL SIGNS: ED Triage Vitals  Enc Vitals Group     BP 06/19/20 1601 (!) 130/94     Pulse Rate 06/19/20 1601 65     Resp 06/19/20 1601 16     Temp 06/19/20 1601 98.2 F (36.8 C)     Temp Source 06/19/20 1601 Oral     SpO2 06/19/20 1601 99 %     Weight 06/19/20 1602 185 lb (83.9 kg)     Height 06/19/20 1602 5' (1.524 m)     Head Circumference --      Peak Flow --      Pain  Score 06/19/20 1602 7     Pain Loc --      Pain Edu? --      Excl. in [place]? --      Physical Exam Vitals and nursing note reviewed.  Constitutional:      General: She is not in acute distress.    Appearance: She is well-developed.  HENT:     Head: Normocephalic and atraumatic.     Comments: No appreciable scalp hematoma or trauma. No periorbital or postauricular ecchymoses. No hemotympanum, Eyes:     Conjunctiva/sclera: Conjunctivae normal.  Neck:     Comments: No significant midline or paraspinal TTP Cardiovascular:     Rate and Rhythm: Normal rate and regular rhythm.     Heart sounds: Normal heart sounds.  Pulmonary:     Effort: Pulmonary effort is normal. No respiratory distress.     Breath sounds: No wheezing.  Abdominal:     General: There is no distension.  Musculoskeletal:     Cervical back: Neck supple.  Skin:    General: Skin is warm.     Capillary Refill: Capillary refill takes less than 2 seconds.     Findings: No rash.  Neurological:     Mental Status: She is alert and oriented to person, place, and time.     Motor: No abnormal muscle tone.     Comments: CNII-XII intact. Strength 5/5 proximally and distally bl UE and LE. Normal sensation to light touch.       ____________________________________________   LABS (all labs ordered are listed, but only abnormal results are displayed)  Labs Reviewed - No data to display  ____________________________________________  EKG: None ________________________________________  RADIOLOGY All imaging, including plain films, CT scans, and ultrasounds, independently reviewed by me, and interpretations confirmed via formal radiology reads.  ED MD interpretation:   CT Head/C-Spine: NAICA, no acute cervical abnormality or fx  Official radiology report(s): CT Head Wo Contrast  Result Date: 06/19/2020 CLINICAL DATA:  Head trauma, headache. Additional history provided: Patient fell approximately 5 feet on Saturday  evening off steps of a camper, hit head on rocks, headache and neck pain since fall. EXAM: CT HEAD WITHOUT CONTRAST CT CERVICAL SPINE WITHOUT CONTRAST TECHNIQUE: Multidetector CT imaging of the head and cervical spine was performed following the standard protocol without intravenous contrast. Multiplanar CT image reconstructions of the cervical spine were also generated. COMPARISON:  Brain MRI 11/08/2005. FINDINGS: CT HEAD FINDINGS Brain: Cerebral volume is normal for age. There is no acute intracranial hemorrhage. No demarcated cortical infarct. No extra-axial fluid collection. No evidence of intracranial mass. No midline shift. Partially empty sella turcica. Vascular: No hyperdense vessel. Skull: Normal. Negative for fracture or focal  lesion. Sinuses/Orbits: Visualized orbits show no acute finding. No significant paranasal sinus disease or mastoid effusion at the imaged levels. CT CERVICAL SPINE FINDINGS Alignment: Straightening of the expected cervical lordosis. Trace C3-C4 grade 1 anterolisthesis. Skull base and vertebrae: The basion-dental and atlanto-dental intervals are maintained.No evidence of acute fracture to the cervical spine. Soft tissues and spinal canal: No prevertebral fluid or swelling. No visible canal hematoma. Disc levels: Cervical spondylosis. Most notably there is moderate/advanced disc degeneration at C4-C5, C5-C6 and C6-C7 with posterior disc osteophytes and uncovertebral hypertrophy. Multilevel bony neural foraminal narrowing. Suspected at least mild spinal canal stenosis at C5-C6. Upper chest: No consolidation within the imaged lung apices. No visible pneumothorax. IMPRESSION: CT head: No evidence of acute intracranial abnormality. CT cervical spine: 1. No evidence of acute fracture to the cervical spine. 2. Cervical spondylosis as described and greatest at C4-C5, C5-C6 and C6-C7. 3. Nonspecific mild C3-C4 grade 1 anterolisthesis. Electronically Signed   By: Kellie Simmering DO   On:  06/19/2020 16:50   CT Cervical Spine Wo Contrast  Result Date: 06/19/2020 CLINICAL DATA:  Head trauma, headache. Additional history provided: Patient fell approximately 5 feet on Saturday evening off steps of a camper, hit head on rocks, headache and neck pain since fall. EXAM: CT HEAD WITHOUT CONTRAST CT CERVICAL SPINE WITHOUT CONTRAST TECHNIQUE: Multidetector CT imaging of the head and cervical spine was performed following the standard protocol without intravenous contrast. Multiplanar CT image reconstructions of the cervical spine were also generated. COMPARISON:  Brain MRI 11/08/2005. FINDINGS: CT HEAD FINDINGS Brain: Cerebral volume is normal for age. There is no acute intracranial hemorrhage. No demarcated cortical infarct. No extra-axial fluid collection. No evidence of intracranial mass. No midline shift. Partially empty sella turcica. Vascular: No hyperdense vessel. Skull: Normal. Negative for fracture or focal lesion. Sinuses/Orbits: Visualized orbits show no acute finding. No significant paranasal sinus disease or mastoid effusion at the imaged levels. CT CERVICAL SPINE FINDINGS Alignment: Straightening of the expected cervical lordosis. Trace C3-C4 grade 1 anterolisthesis. Skull base and vertebrae: The basion-dental and atlanto-dental intervals are maintained.No evidence of acute fracture to the cervical spine. Soft tissues and spinal canal: No prevertebral fluid or swelling. No visible canal hematoma. Disc levels: Cervical spondylosis. Most notably there is moderate/advanced disc degeneration at C4-C5, C5-C6 and C6-C7 with posterior disc osteophytes and uncovertebral hypertrophy. Multilevel bony neural foraminal narrowing. Suspected at least mild spinal canal stenosis at C5-C6. Upper chest: No consolidation within the imaged lung apices. No visible pneumothorax. IMPRESSION: CT head: No evidence of acute intracranial abnormality. CT cervical spine: 1. No evidence of acute fracture to the cervical  spine. 2. Cervical spondylosis as described and greatest at C4-C5, C5-C6 and C6-C7. 3. Nonspecific mild C3-C4 grade 1 anterolisthesis. Electronically Signed   By: Kellie Simmering DO   On: 06/19/2020 16:50    ____________________________________________  PROCEDURES   Procedure(s) performed (including Critical Care):  Procedures  ____________________________________________  INITIAL IMPRESSION / MDM / [place] / ED COURSE  As part of my medical decision making, I reviewed the following data within the [place] notes reviewed and incorporated, Old chart reviewed, Notes from prior ED visits, and [place] Controlled Substance Database       *Bridget [name] was evaluated in Emergency Department on 06/19/2020 for the symptoms described in the history of present illness. She was evaluated in the context of the global COVID-19 pandemic, which necessitated consideration that the patient might be at risk for infection with  the SARS-CoV-2 virus that causes COVID-19. Institutional protocols and algorithms that pertain to the evaluation of patients at risk for COVID-19 are in a state of rapid change based on information released by regulatory bodies including the CDC and federal and state organizations. These policies and algorithms were followed during the patient's care in the ED.  Some ED evaluations and interventions may be delayed as a result of limited staffing during the pandemic.*     Medical Decision Making:  64 yo F here with HA after head injury over the weekend. She is neurologically intact. Imaging neg for acute traumatic injury. She has no UE weakness, numbness, paresthesias or signs to suggest occult central cord injury. No other signs of trauma on exam. Will d/c with supportive care for possible mild concussion. Return precautions encouraged.  ____________________________________________  FINAL CLINICAL IMPRESSION(S) / ED DIAGNOSES  Final diagnoses:    Concussion without loss of consciousness, initial encounter  Injury of head, initial encounter     MEDICATIONS GIVEN DURING THIS VISIT:  Medications - No data to display   ED Discharge Orders    None       Note:  This document was prepared using Dragon voice recognition software and may include unintentional dictation errors.   Duffy Bruce, MD 06/19/20 2237

## 2020-06-19 NOTE — ED Triage Notes (Addendum)
Patient states she fell approx. 5 feet on Saturday evening off the steps of a friend's camper and hit her head on some rocks. Patient states, "I just missed a step and lost my balance."  Patient denies losing consciousness.  Patient states she takes a baby aspirin daily. Patient states since the fall she has had a headache and some neck pain.

## 2021-01-12 ENCOUNTER — Other Ambulatory Visit: Payer: Self-pay | Admitting: Family Medicine

## 2021-01-12 DIAGNOSIS — Z1231 Encounter for screening mammogram for malignant neoplasm of breast: Secondary | ICD-10-CM

## 2021-01-30 ENCOUNTER — Inpatient Hospital Stay: Payer: Medicare Other

## 2021-01-30 ENCOUNTER — Inpatient Hospital Stay: Payer: Medicare Other | Attending: Oncology | Admitting: Oncology

## 2021-01-30 ENCOUNTER — Encounter: Payer: Self-pay | Admitting: Oncology

## 2021-01-30 VITALS — BP 107/73 | HR 66 | Temp 97.8°F | Resp 18 | Wt 193.1 lb

## 2021-01-30 DIAGNOSIS — M129 Arthropathy, unspecified: Secondary | ICD-10-CM | POA: Insufficient documentation

## 2021-01-30 DIAGNOSIS — K219 Gastro-esophageal reflux disease without esophagitis: Secondary | ICD-10-CM | POA: Diagnosis not present

## 2021-01-30 DIAGNOSIS — Z8042 Family history of malignant neoplasm of prostate: Secondary | ICD-10-CM | POA: Diagnosis not present

## 2021-01-30 DIAGNOSIS — E7800 Pure hypercholesterolemia, unspecified: Secondary | ICD-10-CM | POA: Insufficient documentation

## 2021-01-30 DIAGNOSIS — Z9049 Acquired absence of other specified parts of digestive tract: Secondary | ICD-10-CM | POA: Diagnosis not present

## 2021-01-30 DIAGNOSIS — Z79899 Other long term (current) drug therapy: Secondary | ICD-10-CM | POA: Insufficient documentation

## 2021-01-30 DIAGNOSIS — Z7984 Long term (current) use of oral hypoglycemic drugs: Secondary | ICD-10-CM | POA: Insufficient documentation

## 2021-01-30 DIAGNOSIS — Z853 Personal history of malignant neoplasm of breast: Secondary | ICD-10-CM | POA: Diagnosis not present

## 2021-01-30 DIAGNOSIS — F418 Other specified anxiety disorders: Secondary | ICD-10-CM | POA: Insufficient documentation

## 2021-01-30 DIAGNOSIS — R5383 Other fatigue: Secondary | ICD-10-CM | POA: Insufficient documentation

## 2021-01-30 DIAGNOSIS — Z7982 Long term (current) use of aspirin: Secondary | ICD-10-CM | POA: Diagnosis not present

## 2021-01-30 DIAGNOSIS — I10 Essential (primary) hypertension: Secondary | ICD-10-CM | POA: Diagnosis not present

## 2021-01-30 DIAGNOSIS — D72829 Elevated white blood cell count, unspecified: Secondary | ICD-10-CM

## 2021-01-30 DIAGNOSIS — F419 Anxiety disorder, unspecified: Secondary | ICD-10-CM | POA: Insufficient documentation

## 2021-01-30 DIAGNOSIS — Z806 Family history of leukemia: Secondary | ICD-10-CM | POA: Diagnosis not present

## 2021-01-30 DIAGNOSIS — Z809 Family history of malignant neoplasm, unspecified: Secondary | ICD-10-CM | POA: Diagnosis not present

## 2021-01-30 LAB — CBC WITH DIFFERENTIAL/PLATELET
Abs Immature Granulocytes: 0.03 10*3/uL (ref 0.00–0.07)
Basophils Absolute: 0.1 10*3/uL (ref 0.0–0.1)
Basophils Relative: 1 %
Eosinophils Absolute: 0.3 10*3/uL (ref 0.0–0.5)
Eosinophils Relative: 3 %
HCT: 40.2 % (ref 36.0–46.0)
Hemoglobin: 13.4 g/dL (ref 12.0–15.0)
Immature Granulocytes: 0 %
Lymphocytes Relative: 26 %
Lymphs Abs: 2.6 10*3/uL (ref 0.7–4.0)
MCH: 29.5 pg (ref 26.0–34.0)
MCHC: 33.3 g/dL (ref 30.0–36.0)
MCV: 88.5 fL (ref 80.0–100.0)
Monocytes Absolute: 0.8 10*3/uL (ref 0.1–1.0)
Monocytes Relative: 8 %
Neutro Abs: 6.2 10*3/uL (ref 1.7–7.7)
Neutrophils Relative %: 62 %
Platelets: 324 10*3/uL (ref 150–400)
RBC: 4.54 MIL/uL (ref 3.87–5.11)
RDW: 12.7 % (ref 11.5–15.5)
WBC: 10 10*3/uL (ref 4.0–10.5)
nRBC: 0 % (ref 0.0–0.2)

## 2021-01-30 LAB — COMPREHENSIVE METABOLIC PANEL
ALT: 37 U/L (ref 0–44)
AST: 28 U/L (ref 15–41)
Albumin: 4 g/dL (ref 3.5–5.0)
Alkaline Phosphatase: 65 U/L (ref 38–126)
Anion gap: 9 (ref 5–15)
BUN: 20 mg/dL (ref 8–23)
CO2: 30 mmol/L (ref 22–32)
Calcium: 10 mg/dL (ref 8.9–10.3)
Chloride: 100 mmol/L (ref 98–111)
Creatinine, Ser: 0.65 mg/dL (ref 0.44–1.00)
GFR, Estimated: >60 mL/min (ref >60)
Glucose, Bld: 119 mg/dL — ABNORMAL HIGH (ref 70–99)
Potassium: 4.6 mmol/L (ref 3.5–5.1)
Sodium: 139 mmol/L (ref 135–145)
Total Bilirubin: 0.6 mg/dL (ref 0.3–1.2)
Total Protein: 7 g/dL (ref 6.5–8.1)

## 2021-01-30 LAB — HEPATITIS PANEL, ACUTE
HCV Ab: NONREACTIVE
Hep A IgM: NONREACTIVE
Hep B C IgM: NONREACTIVE
Hepatitis B Surface Ag: NONREACTIVE

## 2021-01-30 LAB — LACTATE DEHYDROGENASE: LDH: 138 U/L (ref 98–192)

## 2021-01-30 LAB — HIV ANTIBODY (ROUTINE TESTING W REFLEX): HIV Screen 4th Generation wRfx: NONREACTIVE

## 2021-01-30 LAB — C-REACTIVE PROTEIN: CRP: 0.6 mg/dL (ref <1.0)

## 2021-01-30 LAB — TECHNOLOGIST SMEAR REVIEW
RBC Morphology: NORMAL
Tech Review: NORMAL
WBC Morphology: NORMAL

## 2021-01-30 NOTE — Progress Notes (Signed)
Hematology/Oncology Consult note [facility] Telephone:([phone] [phone]   Patient Care Team: Gayland Curry, MD as PCP - General (Family Medicine)  REFERRING PROVIDER: Gayland Curry, MD  CHIEF COMPLAINTS/REASON FOR VISIT:  Evaluation of leukocytosis  HISTORY OF PRESENTING ILLNESS:   Bridget [name] is a  65 y.o.  female with PMH listed below was seen in consultation at the request of  Gayland Curry, MD  for evaluation of leukocytosis  Patient was previously seen by me more than 3 years ago for leukocytosis.  Patient was discharged at that point as her white blood cell count trended down to closely normalized.  Leukocytosis was thought to be reactive.  Patient was discharged to follow-up with primary care provider has followed her leukocytosis.  Patient was referred back as white count persistently trend up. Patient denies any constitutional symptoms. She has right shoulder arthritis.  No recent oral/IV steroid, steroid joint injections. Denies any implant, prosthetic joints, IUD etc. Patient reports a remote history of breast cancer in 1998.  Patient underwent lumpectomy and radiation in [place].  Denies being treated for any antiestrogen treatments.  She recalls that the breast cancer did not involve her axillary lymph node. Family history is positive for prostate cancer in father.  Paternal aunt had multiple myeloma. Review of Systems  Constitutional: Negative for appetite change, chills, fatigue and fever.  HENT:   Negative for hearing loss and voice change.   Eyes: Negative for eye problems.  Respiratory: Negative for chest tightness and cough.   Cardiovascular: Negative for chest pain.  Gastrointestinal: Negative for abdominal distention, abdominal pain and blood in stool.  Endocrine: Negative for hot flashes.  Genitourinary: Negative for difficulty urinating and frequency.   Musculoskeletal: Negative for arthralgias.  Skin:  Negative for itching and rash.  Neurological: Negative for extremity weakness.  Hematological: Negative for adenopathy.  Psychiatric/Behavioral: Negative for confusion.    MEDICAL HISTORY:  Past Medical History:  Diagnosis Date  . Anxiety   . Arthritis   . Breast cancer ([place]) 1998   Right Breast  . Depression   . GERD (gastroesophageal reflux disease)   . Hypercholesteremia   . Hypertension     SURGICAL HISTORY: Past Surgical History:  Procedure Laterality Date  . ABDOMINAL HYSTERECTOMY    . BREAST LUMPECTOMY Right 1998  . CARPAL TUNNEL RELEASE Left   . CESAREAN SECTION     x2  . CHOLECYSTECTOMY      SOCIAL HISTORY: Social History   Socioeconomic History  . Marital status: Married    Spouse name: Not on file  . Number of children: Not on file  . Years of education: Not on file  . Highest education level: Not on file  Occupational History  . Not on file  Tobacco Use  . Smoking status: Never Smoker  . Smokeless tobacco: Never Used  Vaping Use  . Vaping Use: Never used  Substance and Sexual Activity  . Alcohol use: No  . Drug use: No  . Sexual activity: Not on file  Other Topics Concern  . Not on file  Social History Narrative  . Not on file   Social Determinants of Health   Financial Resource Strain: Not on file  Food Insecurity: Not on file  Transportation Needs: Not on file  Physical Activity: Not on file  Stress: Not on file  Social Connections: Not on file  Intimate Partner Violence: Not on file    FAMILY HISTORY: Family History  Problem Relation Age of  Onset  . Heart disease Mother   . Hypertension Mother   . Vascular Disease Mother   . Macular degeneration Mother   . COPD Mother   . Prostate cancer Father   . Heart disease Father   . Multiple myeloma Paternal Aunt   . Cancer Maternal Grandfather     ALLERGIES:  is allergic to codeine and latex.  MEDICATIONS:  Current Outpatient Medications  Medication Sig Dispense Refill  .  atorvastatin (LIPITOR) 40 MG tablet Take 40 mg by mouth daily.    Marland Kitchen buPROPion (WELLBUTRIN SR) 150 MG 12 hr tablet Take 150 mg by mouth daily.    . hydrochlorothiazide (MICROZIDE) 12.5 MG capsule Take 12.5 mg by mouth every other day.     . losartan-hydrochlorothiazide (HYZAAR) 50-12.5 MG tablet Take 1 tablet by mouth daily.    . metFORMIN (GLUCOPHAGE-XR) 500 MG 24 hr tablet Take 1,000 mg by mouth daily.     Marland Kitchen omeprazole (PRILOSEC) 20 MG capsule Take 20 mg by mouth every 3 (three) days.     Marland Kitchen PARoxetine (PAXIL) 40 MG tablet Take 40 mg by mouth daily.    Marland Kitchen aspirin 81 MG chewable tablet Chew 81 mg by mouth daily.    . clonazePAM (KLONOPIN) 0.5 MG tablet Take 0.5 mg by mouth as needed.    . naproxen sodium (ANAPROX) 220 MG tablet Take 220 mg by mouth daily as needed.     No current facility-administered medications for this visit.     PHYSICAL EXAMINATION: ECOG PERFORMANCE STATUS: 0 - Asymptomatic Vitals:   01/30/21 1124  BP: 107/73  Pulse: 66  Resp: 18  Temp: 97.8 F (36.6 C)   Filed Weights   01/30/21 1124  Weight: 193 lb 1.6 oz (87.6 kg)    Physical Exam Constitutional:      General: She is not in acute distress.    Appearance: She is obese.  HENT:     Head: Normocephalic and atraumatic.  Eyes:     General: No scleral icterus. Cardiovascular:     Rate and Rhythm: Normal rate and regular rhythm.     Heart sounds: Normal heart sounds.  Pulmonary:     Effort: Pulmonary effort is normal. No respiratory distress.     Breath sounds: No wheezing.  Abdominal:     General: Bowel sounds are normal. There is no distension.     Palpations: Abdomen is soft.  Musculoskeletal:        General: No deformity. Normal range of motion.     Cervical back: Normal range of motion and neck supple.  Skin:    General: Skin is warm and dry.     Findings: No erythema or rash.  Neurological:     Mental Status: She is alert and oriented to person, place, and time. Mental status is at baseline.      Cranial Nerves: No cranial nerve deficit.     Coordination: Coordination normal.  Psychiatric:        Mood and Affect: Mood normal.     LABORATORY DATA:  I have reviewed the data as listed Lab Results  Component Value Date   WBC 10.0 01/30/2021   HGB 13.4 01/30/2021   HCT 40.2 01/30/2021   MCV 88.5 01/30/2021   PLT 324 01/30/2021   Recent Labs    01/30/21 1151  NA 139  K 4.6  CL 100  CO2 30  GLUCOSE 119*  BUN 20  CREATININE 0.65  CALCIUM 10.0  GFRNONAA >60  PROT  7.0  ALBUMIN 4.0  AST 28  ALT 37  ALKPHOS 65  BILITOT 0.6   Iron/TIBC/Ferritin/ %Sat No results found for: IRON, TIBC, FERRITIN, IRONPCTSAT    RADIOGRAPHIC STUDIES: I have personally reviewed the radiological images as listed and agreed with the findings in the report. No results found.    ASSESSMENT & PLAN:  1. Leukocytosis, unspecified type   2. Family history of cancer   Leukocytosis Labs revie leukocytosis wed and discussed with patient that Leukocytosis, predominantly neutrophilia, can be secondary to infection, chronic inflammation, autoimmune disease, or underlying bone marrow disorders.  No recommend to check CBC, CMP, smear, hepatitis panel, HIV, LDH, ANA, CRP, flow cytometry, SPEP.  Family history of cancer, we discussed with her about option of genetic testing.  Patient would like to consider and update me if she would like to proceed with genetic counseling. Patient will follow up with me 2 weeks to go over lab results and management plan. Orders Placed This Encounter  Procedures  . Comprehensive metabolic panel    Standing Status:   Future    Number of Occurrences:   1    Standing Expiration Date:   01/30/2022  . CBC with Differential/Platelet    Standing Status:   Future    Number of Occurrences:   1    Standing Expiration Date:   01/30/2022  . Technologist smear review    Standing Status:   Future    Number of Occurrences:   1    Standing Expiration Date:   01/30/2022  .  Hepatitis panel, acute    Standing Status:   Future    Number of Occurrences:   1    Standing Expiration Date:   01/30/2022  . ANA, IFA (with reflex)    Standing Status:   Future    Number of Occurrences:   1    Standing Expiration Date:   01/30/2022  . HIV Antibody (routine testing w rflx)    Standing Status:   Future    Number of Occurrences:   1    Standing Expiration Date:   01/30/2022  . Lactate dehydrogenase    Standing Status:   Future    Number of Occurrences:   1    Standing Expiration Date:   01/30/2022  . Flow cytometry panel-leukemia/lymphoma work-up    Standing Status:   Future    Number of Occurrences:   1    Standing Expiration Date:   01/30/2022  . Protein electrophoresis, serum    Standing Status:   Future    Number of Occurrences:   1    Standing Expiration Date:   01/30/2022  . C-reactive protein    Standing Status:   Future    Number of Occurrences:   1    Standing Expiration Date:   01/30/2022    All questions were answered. The patient knows to call the clinic with any problems questions or concerns.   Gayland Curry, MD    Return of visit: 2 weeks Thank you for this kind referral and the opportunity to participate in the care of this patient. A copy of today's note is routed to referring provider    Earlie Server, MD, PhD Hematology Oncology [facility] Pager- [phone] 01/30/2021

## 2021-01-30 NOTE — Progress Notes (Signed)
Patient last seen by Dr. Tasia Catchings in 2018 for leukocytosis.

## 2021-01-31 ENCOUNTER — Encounter: Payer: Medicare Other | Attending: Family Medicine | Admitting: *Deleted

## 2021-01-31 ENCOUNTER — Encounter: Payer: Self-pay | Admitting: *Deleted

## 2021-01-31 ENCOUNTER — Other Ambulatory Visit: Payer: Self-pay

## 2021-01-31 VITALS — BP 110/70 | Ht 60.0 in | Wt 191.3 lb

## 2021-01-31 DIAGNOSIS — E119 Type 2 diabetes mellitus without complications: Secondary | ICD-10-CM | POA: Diagnosis not present

## 2021-01-31 NOTE — Patient Instructions (Addendum)
Check blood sugars 1 x day before breakfast or 2 hrs after one meal every day Bring blood sugar records to the next class  Exercise: Continue walking for   15  minutes  2   days a week and gradually increase to 30 minutes 5 x week  Eat 3 meals day,  1-2  snacks a day Space meals 4-6 hours apart Allow 2-3 hours between meals and snacks Limit desserts/sweets  Return for classes on:

## 2021-02-01 LAB — PROTEIN ELECTROPHORESIS, SERUM
A/G Ratio: 1.5 (ref 0.7–1.7)
Albumin ELP: 3.8 g/dL (ref 2.9–4.4)
Alpha-1-Globulin: 0.2 g/dL (ref 0.0–0.4)
Alpha-2-Globulin: 0.6 g/dL (ref 0.4–1.0)
Beta Globulin: 1.1 g/dL (ref 0.7–1.3)
Gamma Globulin: 0.6 g/dL (ref 0.4–1.8)
Globulin, Total: 2.6 g/dL (ref 2.2–3.9)
Total Protein ELP: 6.4 g/dL (ref 6.0–8.5)

## 2021-02-01 LAB — COMP PANEL: LEUKEMIA/LYMPHOMA

## 2021-02-01 LAB — ANTINUCLEAR ANTIBODIES, IFA: ANA Ab, IFA: NEGATIVE

## 2021-02-01 NOTE — Progress Notes (Signed)
Diabetes Self-Management Education  Visit Type: First/Initial  Appt. Start Time: 1550 Appt. End Time: 4332  01/31/2021  [name], identified by name and date of birth, is a 65 y.o. female with a diagnosis of Diabetes: Type 2.   ASSESSMENT  Blood pressure 110/70, height 5' (1.524 m), weight 191 lb 4.8 oz (86.8 kg). Body mass index is 37.36 kg/m.   Diabetes Self-Management Education - 01/31/21 1715      Visit Information   Visit Type First/Initial      Initial Visit   Diabetes Type Type 2    Are you currently following a meal plan? No    Are you taking your medications as prescribed? Yes    Date Diagnosed Jan 2022      Health Coping   How would you rate your overall health? Good      Psychosocial Assessment   Patient Belief/Attitude about Diabetes Other (comment)   "anxious"   Self-care barriers None    Self-management support Doctor's office;Family    Patient Concerns Nutrition/Meal planning;Glycemic Control;Medication;Weight Control    Special Needs None    Preferred Learning Style Visual    Learning Readiness Change in progress    How often do you need to have someone help you when you read instructions, pamphlets, or other written materials from your doctor or pharmacy? 1 - Never    What is the last grade level you completed in school? college 3 years      Pre-Education Assessment   Patient understands the diabetes disease and treatment process. Needs Instruction    Patient understands incorporating nutritional management into lifestyle. Needs Instruction    Patient undertands incorporating physical activity into lifestyle. Needs Instruction    Patient understands using medications safely. Needs Instruction    Patient understands monitoring blood glucose, interpreting and using results Needs Review    Patient understands prevention, detection, and treatment of acute complications. Needs Instruction    Patient understands prevention, detection, and treatment of  chronic complications. Needs Instruction    Patient understands how to develop strategies to address psychosocial issues. Needs Instruction    Patient understands how to develop strategies to promote health/change behavior. Needs Instruction      Complications   Last HgB A1C per patient/outside source 6.5 %   01/15/2021   How often do you check your blood sugar? 1-2 times/day    Fasting Blood glucose range (mg/dL) 130-179   Pt reports FBG's 140-165 mg/dL.   Have you had a dilated eye exam in the past 12 months? Yes    Have you had a dental exam in the past 12 months? Yes    Are you checking your feet? No      Dietary Intake   Breakfast Greek yogurt with berries (strawberries, blackberries, blueberries); thin bagel with cream cheese, English muffin with peanut butter    Lunch sometimes skips - microwave meal - chicken and pasta; left overs; burger 1 x week and sometimes with FF    Snack (afternoon) crackers (goldfish, cheese its), candy, fruit (apple, banana, madnarin oranges)    Dinner chicken beef, [place], pork 1 x month; occasional potatoes, rice, pasta, beans, green beans, salad with lettuce cheese and bacon bits; tomatoes, carrots    Snack (evening) SF pudding, fruit bar, popcicle, chips and salsa    Beverage(s) water, diet soda      Exercise   Exercise Type Light (walking / raking leaves)    How many days per week to you exercise?  2    How many minutes per day do you exercise? 15    Total minutes per week of exercise 30      Patient Education   Previous Diabetes Education No    Disease state  Definition of diabetes, type 1 and 2, and the diagnosis of diabetes;Factors that contribute to the development of diabetes    Nutrition management  Role of diet in the treatment of diabetes and the relationship between the three main macronutrients and blood glucose level;Food label reading, portion sizes and measuring food.;Reviewed blood glucose goals for pre and post meals and how to evaluate  the patients' food intake on their blood glucose level.    Physical activity and exercise  Role of exercise on diabetes management, blood pressure control and cardiac health.    Medications Reviewed patients medication for diabetes, action, purpose, timing of dose and side effects.    Monitoring Purpose and frequency of SMBG.;Taught/discussed recording of test results and interpretation of SMBG.;Identified appropriate SMBG and/or A1C goals.    Chronic complications Relationship between chronic complications and blood glucose control    Psychosocial adjustment Identified and addressed patients feelings and concerns about diabetes      Individualized Goals (developed by patient)   Reducing Risk Other (comment)   improve blood sugars, reduce medications, lose weight     Outcomes   Expected Outcomes Demonstrated interest in learning. Expect positive outcomes    Future DMSE 2 wks           Individualized Plan for Diabetes Self-Management Training:   Learning Objective:  Patient will have a greater understanding of diabetes self-management. Patient education plan is to attend individual and/or group sessions per assessed needs and concerns.   Plan:   Patient Instructions  Check blood sugars 1 x day before breakfast or 2 hrs after one meal every day Bring blood sugar records to the next class  Exercise: Continue walking for   15  minutes  2   days a week and gradually increase to 30 minutes 5 x week  Eat 3 meals day,  1-2  snacks a day Space meals 4-6 hours apart Allow 2-3 hours between meals and snacks Limit desserts/sweets   Expected Outcomes:  Demonstrated interest in learning. Expect positive outcomes  Education material provided:  General Meal Planning Guidelines Simple Meal Plan  If problems or questions, patient to contact team via: Johny Drilling, RN, [place] [phone]  Future DSME appointment: 2 wks  February 19, 2021 for Diabetes Class 1

## 2021-02-13 ENCOUNTER — Inpatient Hospital Stay: Payer: Medicare Other | Admitting: Oncology

## 2021-02-19 ENCOUNTER — Other Ambulatory Visit: Payer: Self-pay

## 2021-02-19 ENCOUNTER — Encounter: Payer: Medicare Other | Admitting: Dietician

## 2021-02-19 ENCOUNTER — Encounter: Payer: Self-pay | Admitting: Dietician

## 2021-02-19 VITALS — Ht 60.0 in | Wt 188.1 lb

## 2021-02-19 DIAGNOSIS — E119 Type 2 diabetes mellitus without complications: Secondary | ICD-10-CM

## 2021-02-19 NOTE — Progress Notes (Signed)

## 2021-02-21 ENCOUNTER — Encounter: Payer: Self-pay | Admitting: Oncology

## 2021-02-21 ENCOUNTER — Inpatient Hospital Stay: Payer: Medicare Other | Attending: Oncology | Admitting: Oncology

## 2021-02-21 DIAGNOSIS — D72829 Elevated white blood cell count, unspecified: Secondary | ICD-10-CM

## 2021-02-21 NOTE — Progress Notes (Signed)
**Note Bridget-Identified via Obfuscation** HEMATOLOGY-ONCOLOGY TeleHEALTH VISIT PROGRESS NOTE  I connected with Bridget [name] on 02/21/21  at  2:45 PM EST by video enabled telemedicine visit and verified that I am speaking with the correct person using two identifiers. I discussed the limitations, risks, security and privacy concerns of performing an evaluation and management service by telemedicine and the availability of in-person appointments. The patient expressed understanding and agreed to proceed.   Other persons participating in the visit and their role in the encounter:  None  Patient's location: Home  Provider's location: office Chief Complaint: Leukocytosis   INTERVAL HISTORY Bridget [name] is a 65 y.o. female who has above history reviewed by me today presents for follow up visit for leukocytosis Problems and complaints are listed below:  Patient reports feeling well.  Presents virtually to discuss results.  No new complaints.  Review of Systems  Constitutional: Negative for appetite change, chills, fatigue and fever.  HENT:   Negative for hearing loss and voice change.   Eyes: Negative for eye problems.  Respiratory: Negative for chest tightness and cough.   Cardiovascular: Negative for chest pain.  Gastrointestinal: Negative for abdominal distention, abdominal pain and blood in stool.  Endocrine: Negative for hot flashes.  Genitourinary: Negative for difficulty urinating and frequency.   Musculoskeletal: Negative for arthralgias.  Skin: Negative for itching and rash.  Neurological: Negative for extremity weakness.  Hematological: Negative for adenopathy.  Psychiatric/Behavioral: Negative for confusion.    Past Medical History:  Diagnosis Date  . Anxiety   . Arthritis   . Breast cancer ([facility]) 1998   Right Breast  . Depression   . Diabetes mellitus without complication ([facility])   . GERD (gastroesophageal reflux disease)   . Hypercholesteremia   . Hypertension    Past Surgical History:  Procedure  Laterality Date  . ABDOMINAL HYSTERECTOMY    . BREAST LUMPECTOMY Right 1998  . CARPAL TUNNEL RELEASE Left   . CESAREAN SECTION     x2  . CHOLECYSTECTOMY      Family History  Problem Relation Age of Onset  . Heart disease Mother   . Hypertension Mother   . Vascular Disease Mother   . Macular degeneration Mother   . COPD Mother   . Prostate cancer Father   . Heart disease Father   . Multiple myeloma Paternal Aunt   . Cancer Maternal Grandfather   . Diabetes Paternal Aunt   . Diabetes Paternal Aunt   . Diabetes Paternal Aunt     Social History   Socioeconomic History  . Marital status: Married    Spouse name: Not on file  . Number of children: Not on file  . Years of education: Not on file  . Highest education level: Not on file  Occupational History  . Not on file  Tobacco Use  . Smoking status: Never Smoker  . Smokeless tobacco: Never Used  Vaping Use  . Vaping Use: Never used  Substance and Sexual Activity  . Alcohol use: Yes    Comment: less than 1 per week - liquor  . Drug use: No  . Sexual activity: Not on file  Other Topics Concern  . Not on file  Social History Narrative  . Not on file   Social Determinants of Health   Financial Resource Strain: Not on file  Food Insecurity: Not on file  Transportation Needs: Not on file  Physical Activity: Not on file  Stress: Not on file  Social Connections: Not on file  Intimate  Partner Violence: Not on file    Current Outpatient Medications on File Prior to Visit  Medication Sig Dispense Refill  . atorvastatin (LIPITOR) 40 MG tablet Take 40 mg by mouth daily.    Marland Kitchen buPROPion (WELLBUTRIN SR) 150 MG 12 hr tablet Take 150 mg by mouth daily.    . hydrochlorothiazide (MICROZIDE) 12.5 MG capsule Take 12.5 mg by mouth daily.    Marland Kitchen losartan-hydrochlorothiazide (HYZAAR) 50-12.5 MG tablet Take 1 tablet by mouth daily.    . metFORMIN (GLUCOPHAGE-XR) 500 MG 24 hr tablet Take 1,000 mg by mouth daily with breakfast.    .  naproxen sodium (ANAPROX) 220 MG tablet Take 220 mg by mouth daily as needed.    Marland Kitchen omeprazole (PRILOSEC) 20 MG capsule Take 20 mg by mouth every 3 (three) days.     Marland Kitchen PARoxetine (PAXIL) 40 MG tablet Take 40 mg by mouth daily.     No current facility-administered medications on file prior to visit.    Allergies  Allergen Reactions  . Codeine Nausea And Vomiting  . Latex        Observations/Objective: Today's Vitals   02/21/21 1406  PainSc: 0-No pain   There is no height or weight on file to calculate BMI.  Physical Exam Neurological:     Mental Status: She is alert.     CBC    Component Value Date/Time   WBC 10.0 01/30/2021 1151   RBC 4.54 01/30/2021 1151   HGB 13.4 01/30/2021 1151   HCT 40.2 01/30/2021 1151   PLT 324 01/30/2021 1151   MCV 88.5 01/30/2021 1151   MCH 29.5 01/30/2021 1151   MCHC 33.3 01/30/2021 1151   RDW 12.7 01/30/2021 1151   LYMPHSABS 2.6 01/30/2021 1151   MONOABS 0.8 01/30/2021 1151   EOSABS 0.3 01/30/2021 1151   BASOSABS 0.1 01/30/2021 1151    CMP     Component Value Date/Time   NA 139 01/30/2021 1151   K 4.6 01/30/2021 1151   CL 100 01/30/2021 1151   CO2 30 01/30/2021 1151   GLUCOSE 119 (H) 01/30/2021 1151   BUN 20 01/30/2021 1151   CREATININE 0.65 01/30/2021 1151   CALCIUM 10.0 01/30/2021 1151   PROT 7.0 01/30/2021 1151   ALBUMIN 4.0 01/30/2021 1151   AST 28 01/30/2021 1151   ALT 37 01/30/2021 1151   ALKPHOS 65 01/30/2021 1151   BILITOT 0.6 01/30/2021 1151   GFRNONAA >60 01/30/2021 1151   GFRAA  10/26/2008 0825    >60        The eGFR has been calculated using the MDRD equation. This calculation has not been validated in all clinical     Assessment and Plan: 1. Leukocytosis, unspecified type   Labs reviewed and discussed with patient CBC showed high normal end total white count with complete normal differential. Negative ANA, hepatitis panel, HIV.  Peripheral blood flow cytometry is negative for immunophenotypic abnormality.   SPEP showed a negative M protein. Negative CRP. Her chronic intermittent leukocytosis most likely is reactive.  Recommend observation. She is asymptomatic.  I will hold off additional work-up at this point.  Follow Up Instructions: 1 year   I discussed the assessment and treatment plan with the patient. The patient was provided an opportunity to ask questions and all were answered. The patient agreed with the plan and demonstrated an understanding of the instructions.  The patient was advised to call back or seek an in-person evaluation if the symptoms worsen or if the condition fails to  improve as anticipated.   Earlie Server, MD 02/21/2021 8:18 PM

## 2021-02-21 NOTE — Progress Notes (Signed)
Patient contacted for Mychart visit. Pt reports that she started taking classes at the diabetes education center.

## 2021-02-26 ENCOUNTER — Ambulatory Visit: Payer: Medicare Other

## 2021-03-05 ENCOUNTER — Ambulatory Visit: Payer: Medicare Other

## 2021-03-05 ENCOUNTER — Inpatient Hospital Stay: Admission: RE | Admit: 2021-03-05 | Payer: 59 | Source: Ambulatory Visit

## 2021-03-19 ENCOUNTER — Encounter: Payer: Medicare Other | Attending: Family Medicine | Admitting: *Deleted

## 2021-03-19 ENCOUNTER — Encounter: Payer: Self-pay | Admitting: *Deleted

## 2021-03-19 VITALS — Wt 188.4 lb

## 2021-03-19 DIAGNOSIS — E119 Type 2 diabetes mellitus without complications: Secondary | ICD-10-CM

## 2021-03-19 NOTE — Progress Notes (Signed)
Appt. Start Time: 1730 Appt. End Time: 2000  Class 2 Nutritional Management - identify sources of carbohydrate, protein and fat; plan balanced meals; estimate servings of carbohydrates in meals  Psychosocial - identify DM as a source of stress; state the effects of stress on BG control  Exercise - describe the effects of exercise on blood glucose and importance of regular exercise in controlling diabetes; state a plan for personal exercise; verbalize contraindications for exercise  Self-Monitoring - state importance of SMBG; use SMBG results to effectively manage diabetes; identify importance of regular HbA1C testing and goals for results  Acute Complications - recognize hyperglycemia and hypoglycemia with causes and effects; identify blood glucose results as high, low or in control; list steps in treating and preventing high and low blood glucose  Sick Day Guidelines: state appropriate measure to manage blood glucose when ill (need for meds, HBGM plan, when to call physician, need for fluids)  Chronic Complications/Foot, Skin, Eye Dental Care - identify possible long-term complications of diabetes (retinopathy, neuropathy, nephropathy, cardiovascular disease, infections); explain steps in prevention and treatment of chronic complications; state importance of daily self-foot exams; describe how to examine feet and what to look for; explain appropriate eye and dental care  Lifestyle Changes/Goals - state benefits of making appropriate lifestyle changes; identify habits that need to change (meals, tobacco, alcohol); identify strategies to reduce risk factors for personal health  Pregnancy/Sexual Health - state importance of good blood glucose control in preventing sexual problems (impotence, vaginal dryness, infections, loss of desire)  Teaching Materials Used: Class 2 Slide Packet A1C Pamphlet Foot Care Literature Kidney Test Handout Stroke Card Quick and "Balanced" Meal Ideas Carb  Counting and Meal Planning Book Goals for Class 2

## 2021-03-26 ENCOUNTER — Encounter: Payer: Medicare Other | Attending: Family Medicine | Admitting: Dietician

## 2021-03-26 VITALS — BP 118/70 | Ht 60.0 in | Wt 186.0 lb

## 2021-03-26 DIAGNOSIS — E119 Type 2 diabetes mellitus without complications: Secondary | ICD-10-CM

## 2021-03-26 NOTE — Progress Notes (Signed)

## 2021-03-27 ENCOUNTER — Encounter: Payer: Self-pay | Admitting: *Deleted

## 2021-03-30 ENCOUNTER — Other Ambulatory Visit: Payer: Self-pay

## 2021-04-27 ENCOUNTER — Ambulatory Visit: Payer: Medicare Other

## 2021-05-04 ENCOUNTER — Other Ambulatory Visit: Payer: Self-pay

## 2021-05-04 ENCOUNTER — Ambulatory Visit
Admission: RE | Admit: 2021-05-04 | Discharge: 2021-05-04 | Disposition: A | Discharge status: Home / Self Care | Payer: Medicare Other | Source: Ambulatory Visit | Attending: Family Medicine | Admitting: Family Medicine

## 2021-05-04 DIAGNOSIS — Z1231 Encounter for screening mammogram for malignant neoplasm of breast: Secondary | ICD-10-CM

## 2021-11-22 ENCOUNTER — Encounter: Payer: Self-pay | Admitting: *Deleted

## 2022-02-27 ENCOUNTER — Other Ambulatory Visit: Payer: Self-pay

## 2022-02-27 ENCOUNTER — Inpatient Hospital Stay: Payer: Medicare Other | Admitting: Oncology

## 2022-02-27 ENCOUNTER — Encounter: Payer: Self-pay | Admitting: Oncology

## 2022-02-27 ENCOUNTER — Inpatient Hospital Stay: Payer: Medicare Other | Attending: Oncology

## 2022-02-27 VITALS — BP 110/64 | HR 66 | Temp 98.6°F | Wt 191.5 lb

## 2022-02-27 DIAGNOSIS — D72829 Elevated white blood cell count, unspecified: Secondary | ICD-10-CM | POA: Insufficient documentation

## 2022-02-27 DIAGNOSIS — Z803 Family history of malignant neoplasm of breast: Secondary | ICD-10-CM | POA: Insufficient documentation

## 2022-02-27 DIAGNOSIS — K219 Gastro-esophageal reflux disease without esophagitis: Secondary | ICD-10-CM | POA: Insufficient documentation

## 2022-02-27 DIAGNOSIS — I10 Essential (primary) hypertension: Secondary | ICD-10-CM | POA: Insufficient documentation

## 2022-02-27 DIAGNOSIS — F419 Anxiety disorder, unspecified: Secondary | ICD-10-CM | POA: Insufficient documentation

## 2022-02-27 DIAGNOSIS — Z809 Family history of malignant neoplasm, unspecified: Secondary | ICD-10-CM | POA: Diagnosis not present

## 2022-02-27 DIAGNOSIS — M129 Arthropathy, unspecified: Secondary | ICD-10-CM | POA: Insufficient documentation

## 2022-02-27 DIAGNOSIS — Z7984 Long term (current) use of oral hypoglycemic drugs: Secondary | ICD-10-CM | POA: Insufficient documentation

## 2022-02-27 DIAGNOSIS — E7800 Pure hypercholesterolemia, unspecified: Secondary | ICD-10-CM | POA: Diagnosis not present

## 2022-02-27 DIAGNOSIS — Z79899 Other long term (current) drug therapy: Secondary | ICD-10-CM | POA: Insufficient documentation

## 2022-02-27 DIAGNOSIS — E119 Type 2 diabetes mellitus without complications: Secondary | ICD-10-CM | POA: Diagnosis not present

## 2022-02-27 LAB — CBC WITH DIFFERENTIAL/PLATELET
Abs Immature Granulocytes: 0.03 10*3/uL (ref 0.00–0.07)
Basophils Absolute: 0.1 10*3/uL (ref 0.0–0.1)
Basophils Relative: 1 %
Eosinophils Absolute: 0.3 10*3/uL (ref 0.0–0.5)
Eosinophils Relative: 3 %
HCT: 43.2 % (ref 36.0–46.0)
Hemoglobin: 14.1 g/dL (ref 12.0–15.0)
Immature Granulocytes: 0 %
Lymphocytes Relative: 24 %
Lymphs Abs: 2.8 10*3/uL (ref 0.7–4.0)
MCH: 29 pg (ref 26.0–34.0)
MCHC: 32.6 g/dL (ref 30.0–36.0)
MCV: 88.7 fL (ref 80.0–100.0)
Monocytes Absolute: 0.9 10*3/uL (ref 0.1–1.0)
Monocytes Relative: 8 %
Neutro Abs: 7.8 10*3/uL — ABNORMAL HIGH (ref 1.7–7.7)
Neutrophils Relative %: 64 %
Platelets: 361 10*3/uL (ref 150–400)
RBC: 4.87 MIL/uL (ref 3.87–5.11)
RDW: 12.4 % (ref 11.5–15.5)
WBC: 12 10*3/uL — ABNORMAL HIGH (ref 4.0–10.5)
nRBC: 0 % (ref 0.0–0.2)

## 2022-02-27 NOTE — Addendum Note (Signed)
Addended by: Earlie Server on: 02/27/2022 08:29 PM ? ? Modules accepted: Orders ? ?

## 2022-02-27 NOTE — Progress Notes (Signed)
Hematology/Oncology Progress note [phone] [phone]      Patient Care Team: Leim Fabry, MD as PCP - General (Family Medicine)  REFERRING PROVIDER: Leim Fabry, MD  CHIEF COMPLAINTS/REASON FOR VISIT:  Patient follow-up for leukocytosis  HISTORY OF PRESENTING ILLNESS:   Bridget [name] is a  66 y.o.  female with PMH listed below was seen in consultation at the request of  Leim Fabry, MD  for evaluation of leukocytosis  Patient was previously seen by me more than 3 years ago for leukocytosis.  Patient was discharged at that point as her white blood cell count trended down to closely normalized.  Leukocytosis was thought to be reactive.  Patient was discharged to follow-up with primary care provider has followed her leukocytosis.  Patient was referred back as white count persistently trend up. Patient denies any constitutional symptoms. She has right shoulder arthritis.  No recent oral/IV steroid, steroid joint injections. Denies any implant, prosthetic joints, IUD etc. Patient reports a remote history of breast cancer in 1998.  Patient underwent lumpectomy and radiation in [place].  Denies being treated for any antiestrogen treatments.  She recalls that the breast cancer did not involve her axillary lymph node. Family history is positive for prostate cancer in father.  Paternal aunt had multiple myeloma.    INTERVAL HISTORY Bridget [name] is a 66 y.o. female who has above history reviewed by me today presents for follow up visit for leukocytosis. Patient denies any unintentional weight loss, night sweats, fever.  Review of Systems  Constitutional:  Negative for appetite change, chills, fatigue and fever.  HENT:   Negative for hearing loss and voice change.   Eyes:  Negative for eye problems.  Respiratory:  Negative for chest tightness and cough.   Cardiovascular:  Negative for chest pain.  Gastrointestinal:  Negative for abdominal  distention, abdominal pain and blood in stool.  Endocrine: Negative for hot flashes.  Genitourinary:  Negative for difficulty urinating and frequency.   Musculoskeletal:  Negative for arthralgias.  Skin:  Negative for itching and rash.  Neurological:  Negative for extremity weakness.  Hematological:  Negative for adenopathy.  Psychiatric/Behavioral:  Negative for confusion.    MEDICAL HISTORY:  Past Medical History:  Diagnosis Date   Anxiety    Arthritis    Breast cancer (HCC) 1998   Right Breast   Depression    Diabetes mellitus without complication (HCC)    GERD (gastroesophageal reflux disease)    Hypercholesteremia    Hypertension     SURGICAL HISTORY: Past Surgical History:  Procedure Laterality Date   ABDOMINAL HYSTERECTOMY     BREAST LUMPECTOMY Right 1998   CARPAL TUNNEL RELEASE Left    CESAREAN SECTION     x2   CHOLECYSTECTOMY      SOCIAL HISTORY: Social History   Socioeconomic History   Marital status: Married    Spouse name: Not on file   Number of children: Not on file   Years of education: Not on file   Highest education level: Not on file  Occupational History   Not on file  Tobacco Use   Smoking status: Never   Smokeless tobacco: Never  Vaping Use   Vaping Use: Never used  Substance and Sexual Activity   Alcohol use: Yes    Comment: less than 1 per week - liquor   Drug use: No   Sexual activity: Not on file  Other Topics Concern   Not on file  Social History Narrative  Not on file   Social Determinants of Health   Financial Resource Strain: Not on file  Food Insecurity: Not on file  Transportation Needs: Not on file  Physical Activity: Not on file  Stress: Not on file  Social Connections: Not on file  Intimate Partner Violence: Not on file    FAMILY HISTORY: Family History  Problem Relation Age of Onset   Heart disease Mother    Hypertension Mother    Vascular Disease Mother    Macular degeneration Mother    COPD Mother     Prostate cancer Father    Heart disease Father    Multiple myeloma Paternal Aunt    Cancer Maternal Grandfather    Diabetes Paternal Aunt    Diabetes Paternal Aunt    Diabetes Paternal Aunt     ALLERGIES:  is allergic to codeine and latex.  MEDICATIONS:  Current Outpatient Medications  Medication Sig Dispense Refill   atorvastatin (LIPITOR) 40 MG tablet Take 40 mg by mouth daily.     buPROPion (WELLBUTRIN SR) 150 MG 12 hr tablet Take 150 mg by mouth daily.     hydrochlorothiazide (MICROZIDE) 12.5 MG capsule Take 12.5 mg by mouth daily.     metFORMIN (GLUCOPHAGE-XR) 500 MG 24 hr tablet Take 1,000 mg by mouth daily with breakfast.     naproxen sodium (ANAPROX) 220 MG tablet Take 220 mg by mouth daily as needed.     omeprazole (PRILOSEC) 20 MG capsule Take 20 mg by mouth every 3 (three) days.      losartan-hydrochlorothiazide (HYZAAR) 50-12.5 MG tablet Take 1 tablet by mouth daily.     PARoxetine (PAXIL) 40 MG tablet Take 40 mg by mouth daily.     No current facility-administered medications for this visit.     PHYSICAL EXAMINATION: ECOG PERFORMANCE STATUS: 0 - Asymptomatic Vitals:   02/27/22 1311  BP: 110/64  Pulse: 66  Temp: 98.6 F (37 C)   Filed Weights   02/27/22 1311  Weight: 191 lb 8 oz (86.9 kg)    Physical Exam Constitutional:      General: She is not in acute distress.    Appearance: She is obese.  HENT:     Head: Normocephalic and atraumatic.  Eyes:     General: No scleral icterus. Cardiovascular:     Rate and Rhythm: Normal rate and regular rhythm.     Heart sounds: Normal heart sounds.  Pulmonary:     Effort: Pulmonary effort is normal. No respiratory distress.     Breath sounds: No wheezing.  Abdominal:     General: Bowel sounds are normal. There is no distension.     Palpations: Abdomen is soft.  Musculoskeletal:        General: No deformity. Normal range of motion.     Cervical back: Normal range of motion and neck supple.  Skin:    General:  Skin is warm and dry.     Findings: No erythema or rash.  Neurological:     Mental Status: She is alert and oriented to person, place, and time. Mental status is at baseline.     Cranial Nerves: No cranial nerve deficit.     Coordination: Coordination normal.  Psychiatric:        Mood and Affect: Mood normal.    LABORATORY DATA:  I have reviewed the data as listed Lab Results  Component Value Date   WBC 12.0 (H) 02/27/2022   HGB 14.1 02/27/2022   HCT 43.2 02/27/2022  MCV 88.7 02/27/2022   PLT 361 02/27/2022   No results for input(s): NA, K, CL, CO2, GLUCOSE, BUN, CREATININE, CALCIUM, GFRNONAA, GFRAA, PROT, ALBUMIN, AST, ALT, ALKPHOS, BILITOT, BILIDIR, IBILI in the last 8760 hours.  Iron/TIBC/Ferritin/ %Sat No results found for: IRON, TIBC, FERRITIN, IRONPCTSAT    RADIOGRAPHIC STUDIES: I have personally reviewed the radiological images as listed and agreed with the findings in the report. No results found.    ASSESSMENT & PLAN:  1. Leukocytosis, unspecified type   2. Family history of cancer    #Leukocytosis Previous work-up includes negative ANA,  hepatitis panel, HIV.  Peripheral blood flow cytometry is negative for immunophenotypic abnormality.  SPEP showed a negative M protein.Negative CRP. 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  Patient is asymptomatic.  No constitutional symptoms.   Recommend observation.  Family history of cancer, and personal history of breast cancer.  I recommend genetic testing.  She agrees.  We will refer her to genetic counselor.  Remote personal history of breast cancer, continue mammogram  Annually-managed by PCP.  Orders Placed This Encounter  Procedures   CBC with Differential/Platelet    Standing Status:   Future    Standing Expiration Date:   02/28/2023   Ambulatory referral to Genetics    Referral Priority:   Routine    Referral Type:   Consultation    Referral Reason:   Specialty Services Required    Number of  Visits Requested:   1    All questions were answered. The patient knows to call the clinic with any problems questions or concerns.  cc Gayland Curry, MD   Patient prefers to continue follow-up with hematology.  She will follow-up in 1 year with repeat blood work.  Earlie Server, MD, PhD  02/27/2022

## 2022-03-07 ENCOUNTER — Other Ambulatory Visit: Payer: Self-pay | Admitting: Family Medicine

## 2022-04-25 ENCOUNTER — Ambulatory Visit
Admission: RE | Admit: 2022-04-25 | Discharge: 2022-04-25 | Disposition: A | Discharge status: Home / Self Care | Payer: Medicare Other | Source: Ambulatory Visit | Attending: Family Medicine | Admitting: Family Medicine

## 2022-04-25 DIAGNOSIS — Z780 Asymptomatic menopausal state: Secondary | ICD-10-CM | POA: Diagnosis not present

## 2022-05-06 ENCOUNTER — Ambulatory Visit
Admission: RE | Admit: 2022-05-06 | Discharge: 2022-05-06 | Disposition: A | Discharge status: Home / Self Care | Payer: Medicare Other | Source: Ambulatory Visit | Attending: Family Medicine | Admitting: Family Medicine

## 2022-05-06 DIAGNOSIS — Z1231 Encounter for screening mammogram for malignant neoplasm of breast: Secondary | ICD-10-CM

## 2022-05-08 ENCOUNTER — Other Ambulatory Visit: Payer: Self-pay | Admitting: Family Medicine

## 2022-05-08 DIAGNOSIS — R928 Other abnormal and inconclusive findings on diagnostic imaging of breast: Secondary | ICD-10-CM

## 2022-05-16 ENCOUNTER — Ambulatory Visit
Admission: RE | Admit: 2022-05-16 | Discharge: 2022-05-16 | Disposition: A | Discharge status: Home / Self Care | Payer: Medicare Other | Source: Ambulatory Visit | Attending: Family Medicine | Admitting: Family Medicine

## 2022-05-16 DIAGNOSIS — R928 Other abnormal and inconclusive findings on diagnostic imaging of breast: Secondary | ICD-10-CM

## 2022-12-30 IMAGING — MG MM DIGITAL DIAGNOSTIC UNILAT*L* W/ TOMO W/ CAD
6 series · 6 of 18 positions shown · non-contrast
Comparison: Previous exam(s).

CLINICAL DATA: Patient recalled from screening for left breast
masses and asymmetry.

EXAM:
DIGITAL DIAGNOSTIC UNILATERAL LEFT MAMMOGRAM WITH TOMOSYNTHESIS AND
CAD; ULTRASOUND LEFT BREAST LIMITED
TECHNIQUE: Left digital diagnostic mammography and breast tomosynthesis was
performed. The images were evaluated with computer-aided detection.;
Targeted ultrasound examination of the left breast was performed.

[L MLO synth-2D]
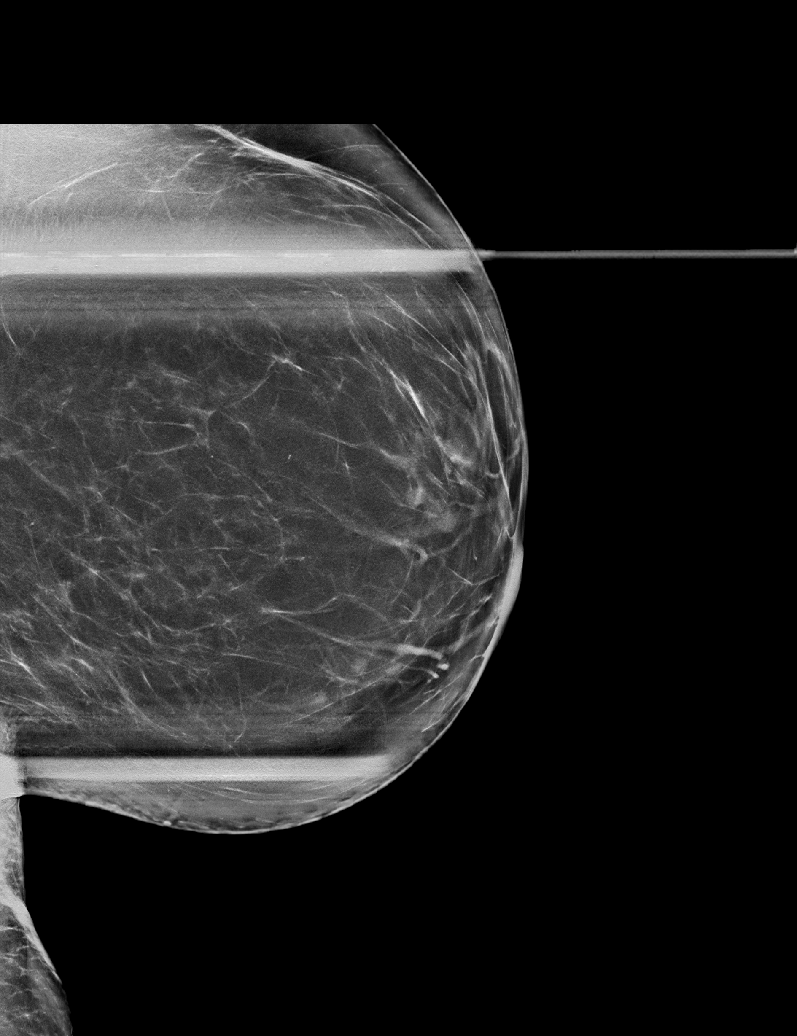

[L ML synth-2D]
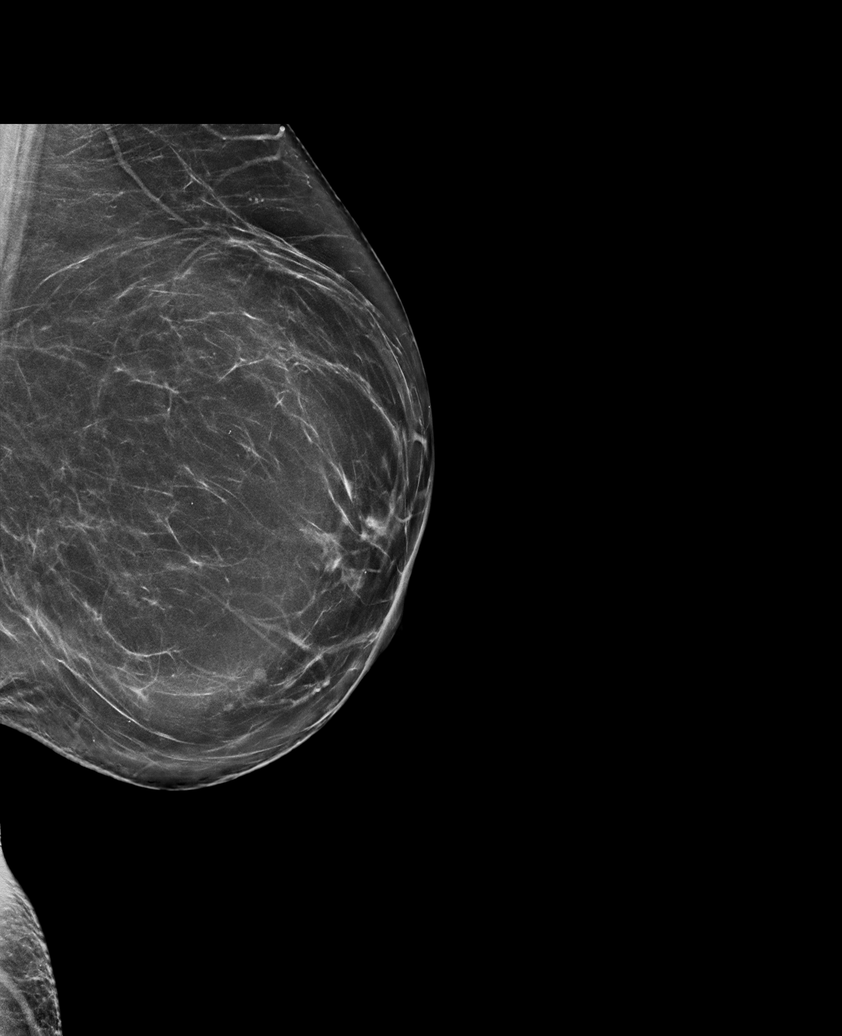

[L CC synth-2D]
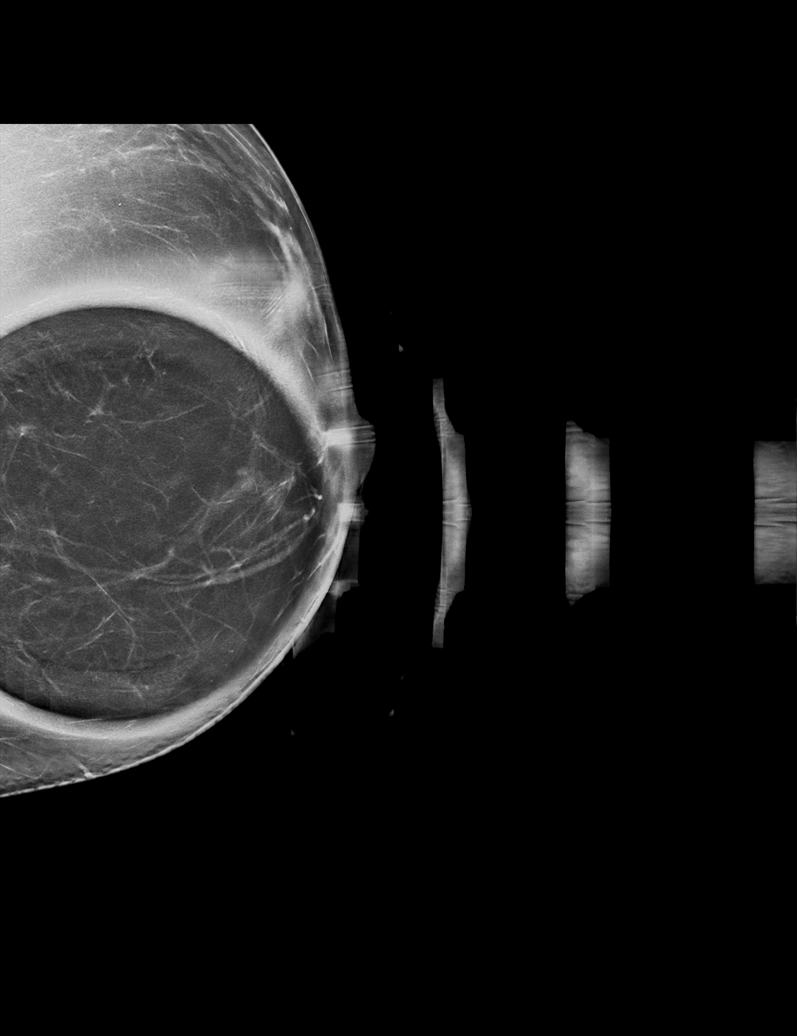

[L MLO tomo · tomo slice 45/88.0]
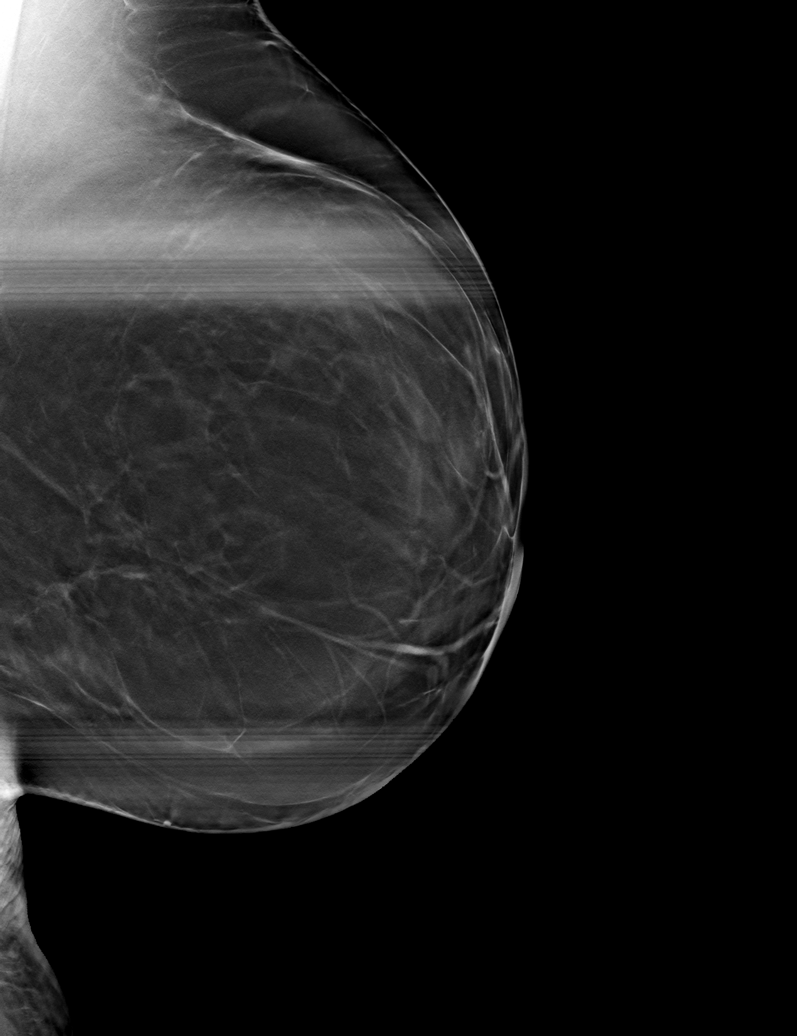

[L ML tomo · tomo slice 41/81.0]
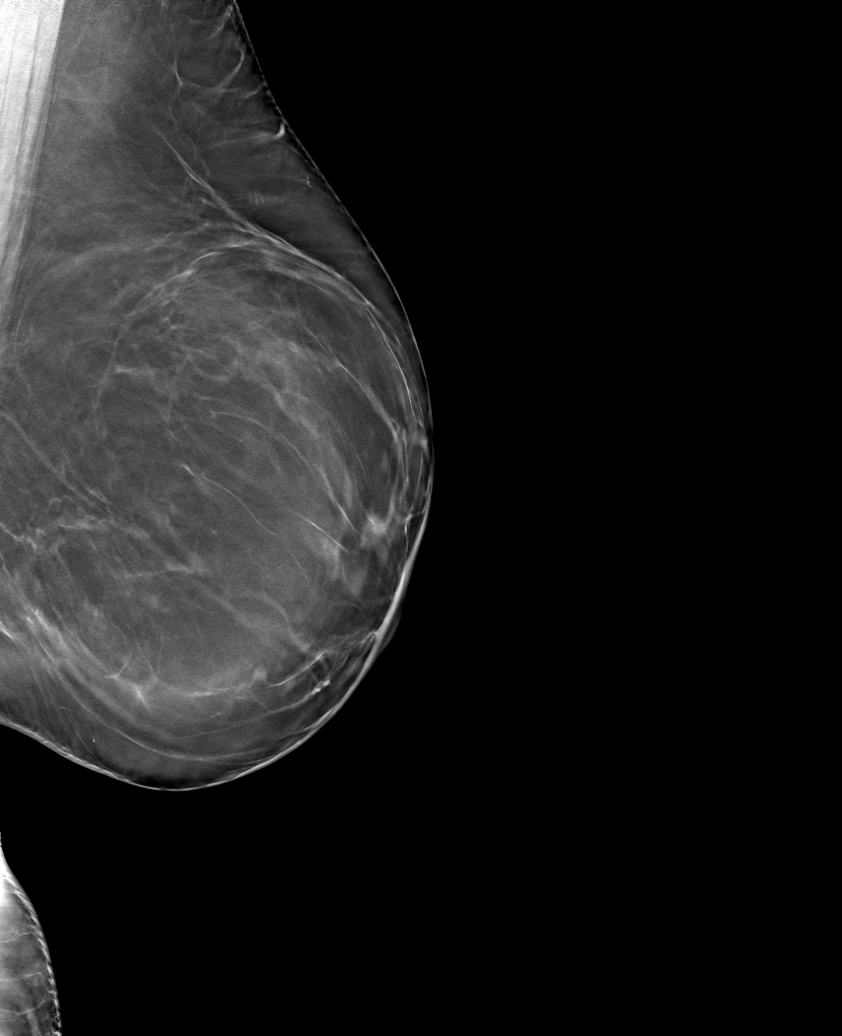

[L CC tomo · tomo slice 36/71.0]
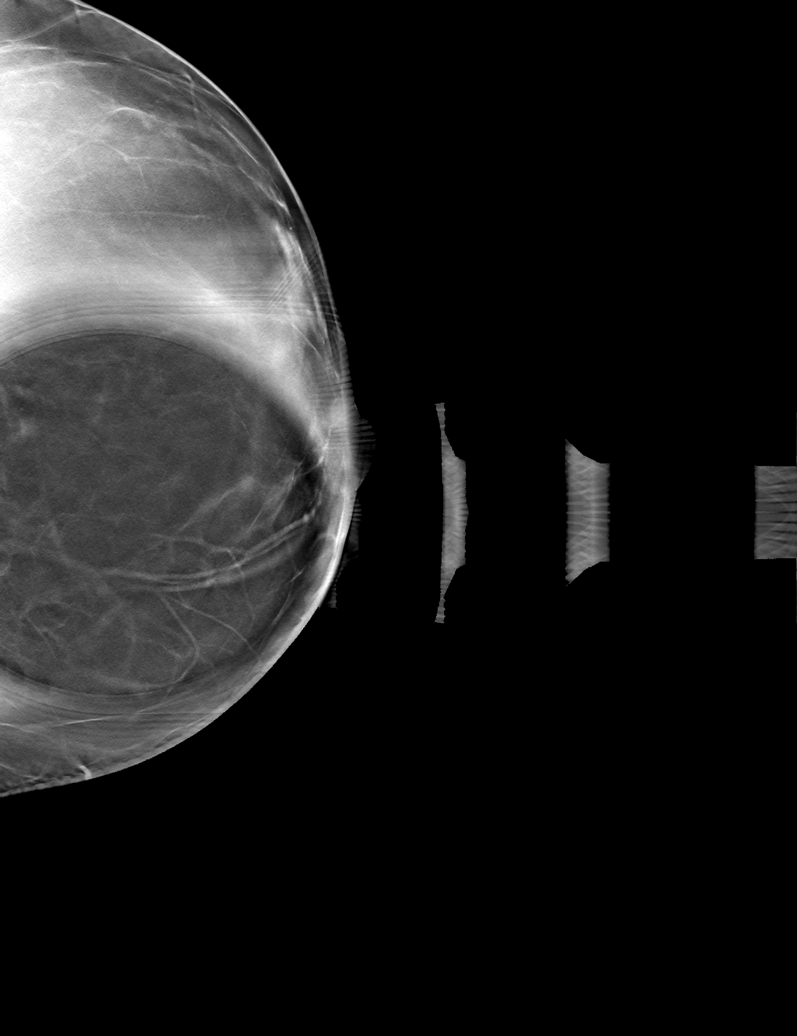

[6 of 18 positions shown; findings below may reference images not displayed]

ACR Breast Density Category b: There are scattered areas of
fibroglandular density.
FINDINGS: Within the anterior inferior left breast there are 3 adjacent small
oval circumscribed masses. Questioned asymmetry within the superior
left breast on the MLO view effaced with additional imaging
suggestive of dense fibroglandular tissue.

Targeted ultrasound is performed, showing a 2 x 2 x 2 mm cyst left
breast 5:30 o'clock 4 cm from nipple, a 3 x 3 x 3 mm cyst left
breast 6 o'clock position 2 cm from nipple and an adjacent 3 x 3 x 3
mm cyst left breast 6 o'clock position 2 cm from nipple.
IMPRESSION: Multiple cysts within the inferior left breast. No suspicious
findings.

RECOMMENDATION:
Screening mammogram in one year.(Code:B8-9-198)

I have discussed the findings and recommendations with the patient.
If applicable, a reminder letter will be sent to the patient
regarding the next appointment.

BI-RADS CATEGORY  2: Benign.

## 2022-12-30 IMAGING — US US BREAST*L* LIMITED INC AXILLA
1 series · 13 of 15 positions shown · non-contrast
Comparison: Previous exam(s).

CLINICAL DATA: Patient recalled from screening for left breast
masses and asymmetry.

EXAM:
DIGITAL DIAGNOSTIC UNILATERAL LEFT MAMMOGRAM WITH TOMOSYNTHESIS AND
CAD; ULTRASOUND LEFT BREAST LIMITED
TECHNIQUE: Left digital diagnostic mammography and breast tomosynthesis was
performed. The images were evaluated with computer-aided detection.;
Targeted ultrasound examination of the left breast was performed.

[Series 1: us breast*left* limited inc axilla · 0.05mm/px · 13 of 15 slices shown]
[im 1/15]
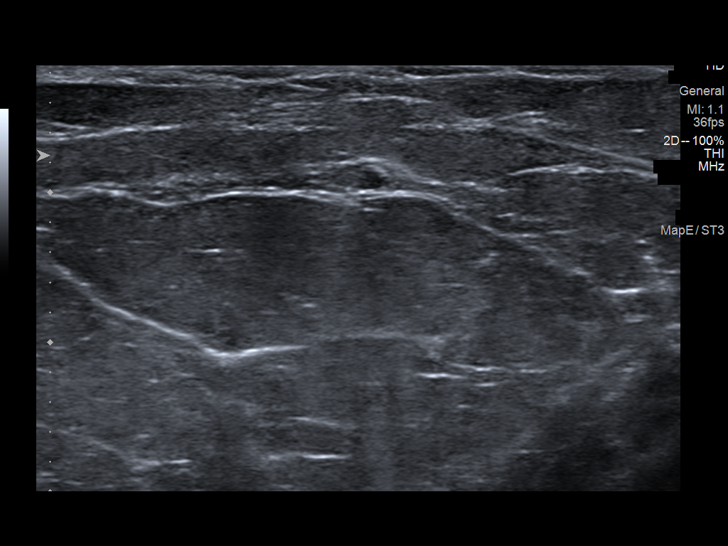
[im 2/15]
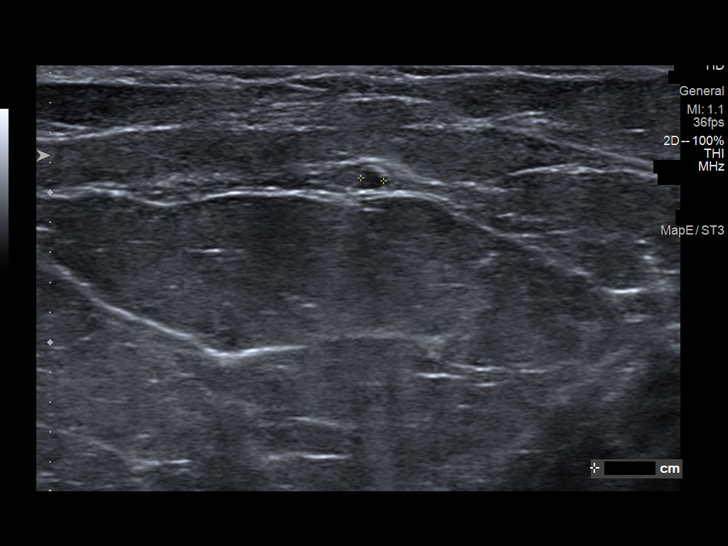
[im 3/15]
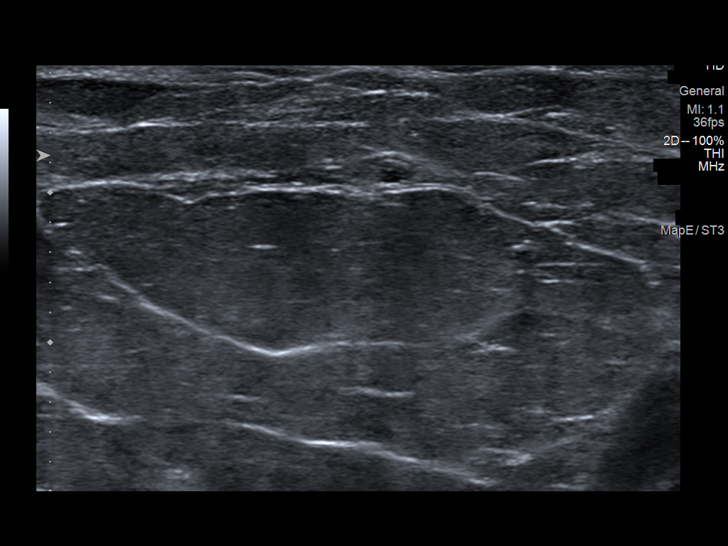
[im 5/15]
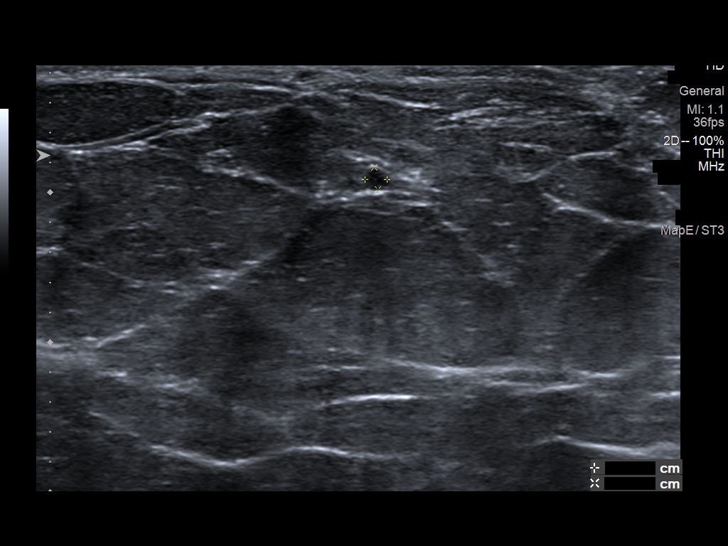
[im 6/15]
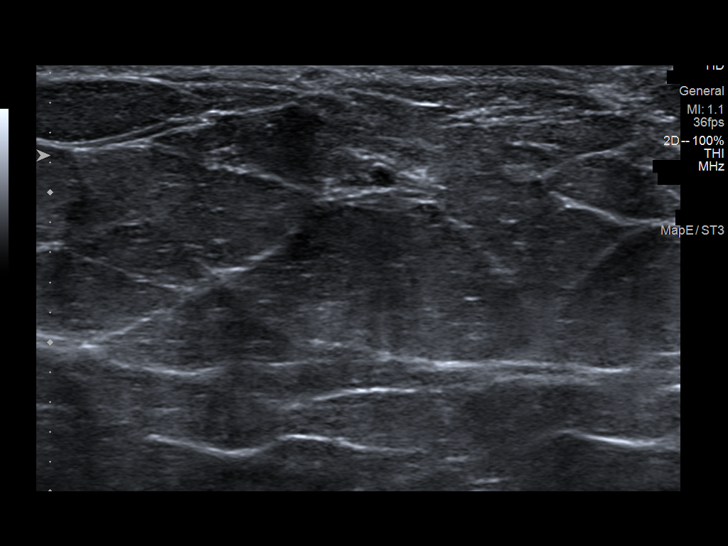
[im 7/15]
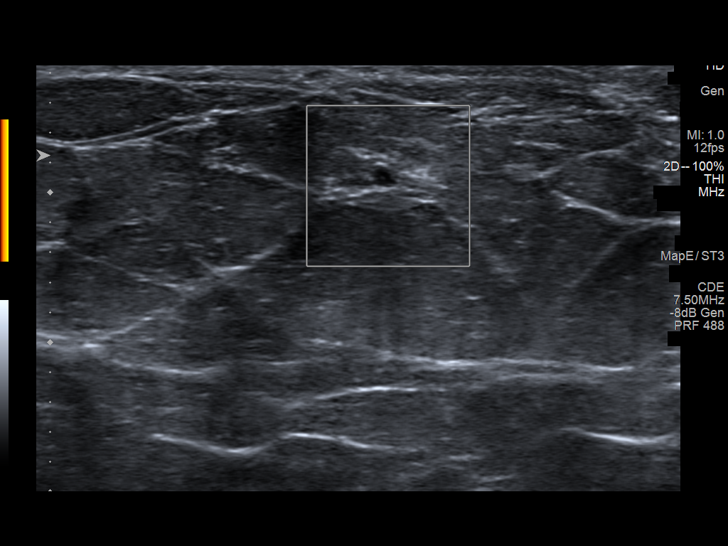
[im 8/15]
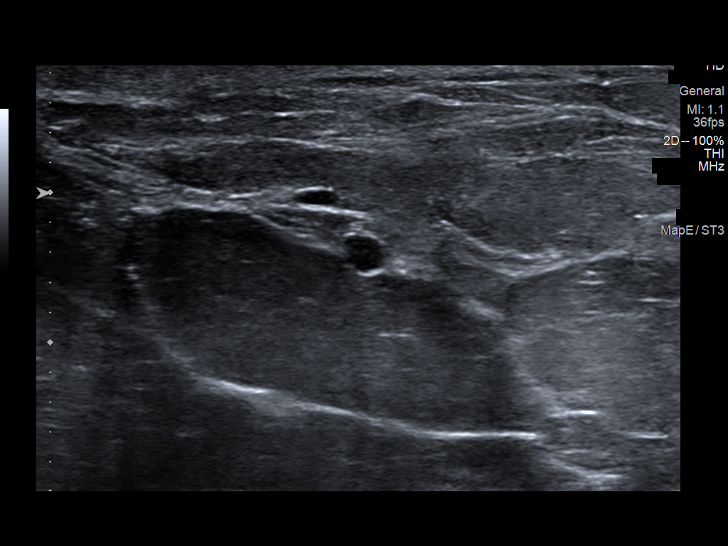
[im 9/15]
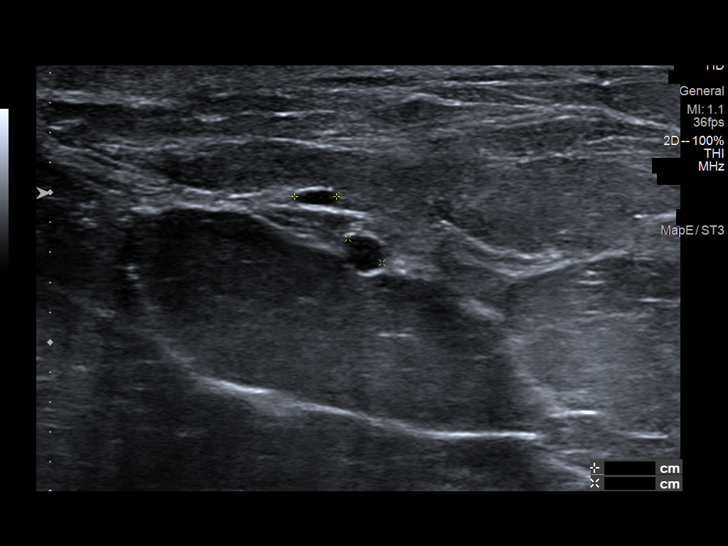
[im 10/15]
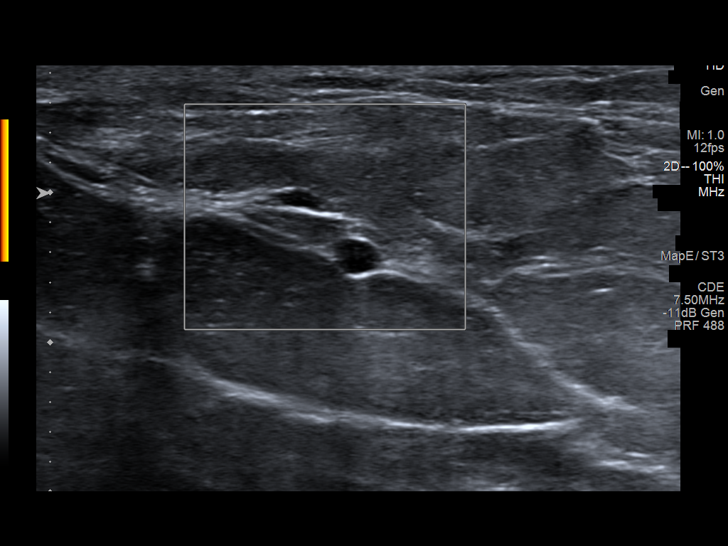
[im 11/15]
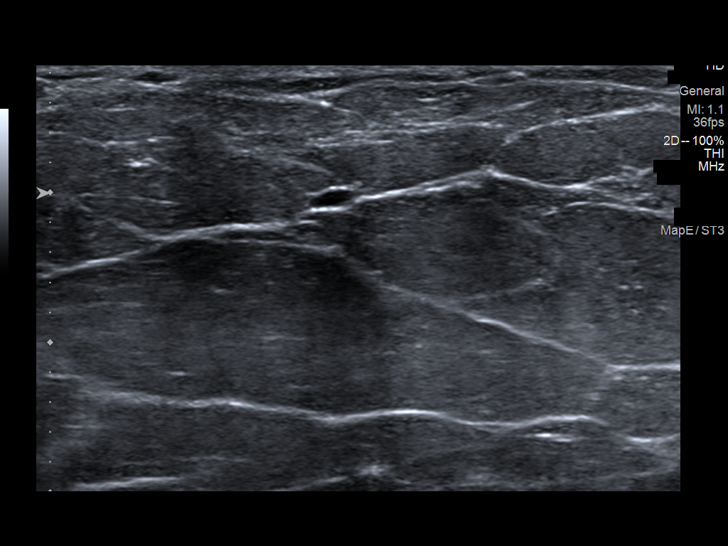
[im 13/15]
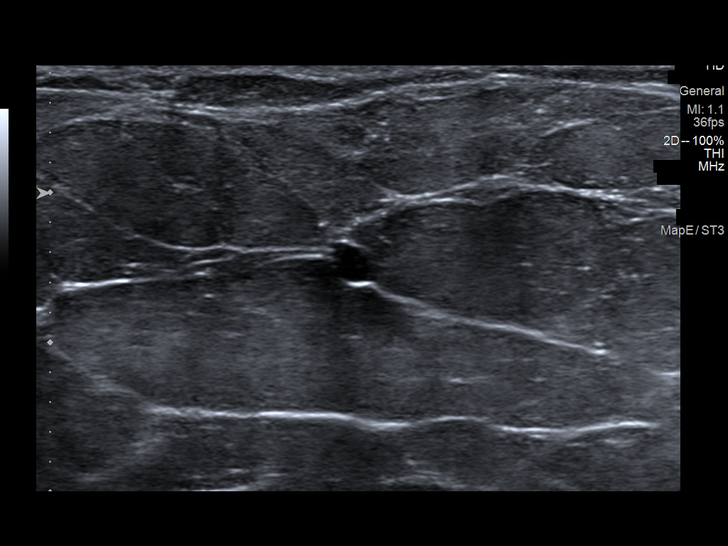
[im 14/15]
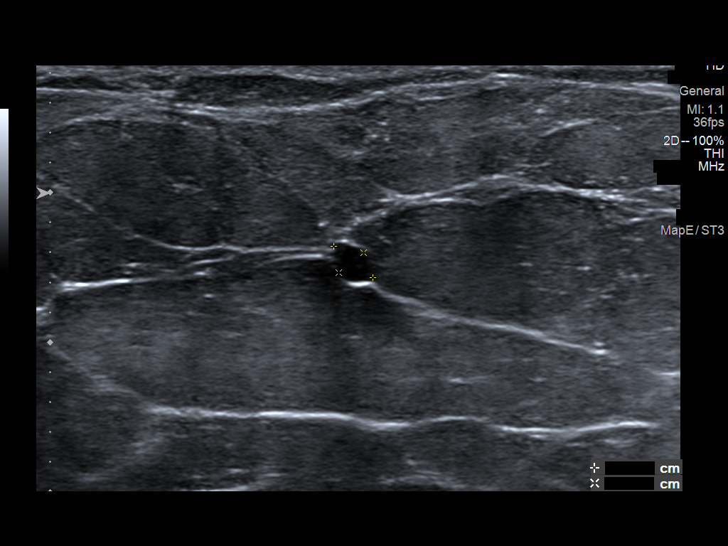
[im 15/15]
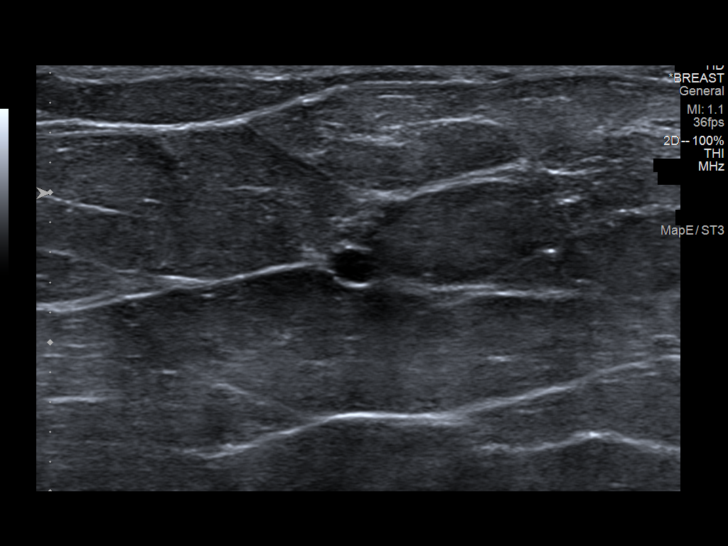

[13 of 15 positions shown; findings below may reference images not displayed]

ACR Breast Density Category b: There are scattered areas of
fibroglandular density.
FINDINGS: Within the anterior inferior left breast there are 3 adjacent small
oval circumscribed masses. Questioned asymmetry within the superior
left breast on the MLO view effaced with additional imaging
suggestive of dense fibroglandular tissue.

Targeted ultrasound is performed, showing a 2 x 2 x 2 mm cyst left
breast 5:30 o'clock 4 cm from nipple, a 3 x 3 x 3 mm cyst left
breast 6 o'clock position 2 cm from nipple and an adjacent 3 x 3 x 3
mm cyst left breast 6 o'clock position 2 cm from nipple.
IMPRESSION: Multiple cysts within the inferior left breast. No suspicious
findings.

RECOMMENDATION:
Screening mammogram in one year.(Code:B8-9-198)

I have discussed the findings and recommendations with the patient.
If applicable, a reminder letter will be sent to the patient
regarding the next appointment.

BI-RADS CATEGORY  2: Benign.

## 2023-02-28 ENCOUNTER — Inpatient Hospital Stay: Payer: Medicare Other | Admitting: Oncology

## 2023-02-28 ENCOUNTER — Inpatient Hospital Stay: Payer: Medicare Other | Attending: Oncology

## 2023-02-28 ENCOUNTER — Encounter: Payer: Self-pay | Admitting: Oncology

## 2023-02-28 VITALS — BP 139/76 | HR 63 | Temp 97.3°F | Resp 18 | Wt 188.8 lb

## 2023-02-28 DIAGNOSIS — M19011 Primary osteoarthritis, right shoulder: Secondary | ICD-10-CM | POA: Diagnosis not present

## 2023-02-28 DIAGNOSIS — Z79899 Other long term (current) drug therapy: Secondary | ICD-10-CM | POA: Diagnosis not present

## 2023-02-28 DIAGNOSIS — Z809 Family history of malignant neoplasm, unspecified: Secondary | ICD-10-CM

## 2023-02-28 DIAGNOSIS — D72829 Elevated white blood cell count, unspecified: Secondary | ICD-10-CM | POA: Insufficient documentation

## 2023-02-28 DIAGNOSIS — Z7984 Long term (current) use of oral hypoglycemic drugs: Secondary | ICD-10-CM | POA: Diagnosis not present

## 2023-02-28 DIAGNOSIS — K219 Gastro-esophageal reflux disease without esophagitis: Secondary | ICD-10-CM | POA: Insufficient documentation

## 2023-02-28 DIAGNOSIS — E7800 Pure hypercholesterolemia, unspecified: Secondary | ICD-10-CM | POA: Diagnosis not present

## 2023-02-28 DIAGNOSIS — E119 Type 2 diabetes mellitus without complications: Secondary | ICD-10-CM | POA: Diagnosis not present

## 2023-02-28 DIAGNOSIS — Z853 Personal history of malignant neoplasm of breast: Secondary | ICD-10-CM | POA: Diagnosis not present

## 2023-02-28 DIAGNOSIS — I10 Essential (primary) hypertension: Secondary | ICD-10-CM | POA: Diagnosis not present

## 2023-02-28 LAB — CBC WITH DIFFERENTIAL/PLATELET
Abs Immature Granulocytes: 0.03 10*3/uL (ref 0.00–0.07)
Basophils Absolute: 0.1 10*3/uL (ref 0.0–0.1)
Basophils Relative: 1 %
Eosinophils Absolute: 0.2 10*3/uL (ref 0.0–0.5)
Eosinophils Relative: 2 %
HCT: 44.3 % (ref 36.0–46.0)
Hemoglobin: 14.1 g/dL (ref 12.0–15.0)
Immature Granulocytes: 0 %
Lymphocytes Relative: 23 %
Lymphs Abs: 2.5 10*3/uL (ref 0.7–4.0)
MCH: 28.2 pg (ref 26.0–34.0)
MCHC: 31.8 g/dL (ref 30.0–36.0)
MCV: 88.6 fL (ref 80.0–100.0)
Monocytes Absolute: 0.6 10*3/uL (ref 0.1–1.0)
Monocytes Relative: 6 %
Neutro Abs: 7.7 10*3/uL (ref 1.7–7.7)
Neutrophils Relative %: 68 %
Platelets: 355 10*3/uL (ref 150–400)
RBC: 5 MIL/uL (ref 3.87–5.11)
RDW: 12.4 % (ref 11.5–15.5)
WBC: 11.1 10*3/uL — ABNORMAL HIGH (ref 4.0–10.5)
nRBC: 0 % (ref 0.0–0.2)

## 2023-02-28 LAB — COMPREHENSIVE METABOLIC PANEL
ALT: 23 U/L (ref 0–44)
AST: 19 U/L (ref 15–41)
Albumin: 4.3 g/dL (ref 3.5–5.0)
Alkaline Phosphatase: 81 U/L (ref 38–126)
Anion gap: 8 (ref 5–15)
BUN: 18 mg/dL (ref 8–23)
CO2: 28 mmol/L (ref 22–32)
Calcium: 9.8 mg/dL (ref 8.9–10.3)
Chloride: 100 mmol/L (ref 98–111)
Creatinine, Ser: 0.77 mg/dL (ref 0.44–1.00)
GFR, Estimated: >60 mL/min (ref >60)
Glucose, Bld: 103 mg/dL — ABNORMAL HIGH (ref 70–99)
Potassium: 4 mmol/L (ref 3.5–5.1)
Sodium: 136 mmol/L (ref 135–145)
Total Bilirubin: 0.5 mg/dL (ref 0.3–1.2)
Total Protein: 7.6 g/dL (ref 6.5–8.1)

## 2023-02-28 NOTE — Assessment & Plan Note (Signed)
#  Leukocytosis Previous work-up includes negative ANA,  hepatitis panel, HIV.  Peripheral blood flow cytometry is negative for immunophenotypic abnormality.  SPEP showed a negative M protein.Negative CRP. 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  Patient is asymptomatic.  No constitutional symptoms.   Recommend observation.

## 2023-02-28 NOTE — Progress Notes (Signed)
Hematology/Oncology Progress note [phone] [phone]   CHIEF COMPLAINTS/REASON FOR VISIT:  Patient follow-up for leukocytosis  ASSESSMENT & PLAN:   Leukocytosis #Leukocytosis Previous work-up includes negative ANA,  hepatitis panel, HIV.  Peripheral blood flow cytometry is negative for immunophenotypic abnormality.  SPEP showed a negative M protein.Negative CRP. 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  Patient is asymptomatic.  No constitutional symptoms.   Recommend observation.  Patient is discharged from my clinic. I recommend patient to continue follow up with primary care physician. Patient may re-establish care in the future if clinically indicated.  All questions were answered.   Earlie Server, MD, PhD [facility] Hematology Oncology 02/28/2023   HISTORY OF PRESENTING ILLNESS:   Patient was previously seen by me more than 3 years ago for leukocytosis.  Patient was discharged at that point as her white blood cell count trended down to closely normalized.  Leukocytosis was thought to be reactive.  Patient was discharged to follow-up with primary care provider has followed her leukocytosis.  Patient was referred back as white count persistently trend up. Patient denies any constitutional symptoms. She has right shoulder arthritis.  No recent oral/IV steroid, steroid joint injections. Denies any implant, prosthetic joints, IUD etc. Patient reports a remote history of breast cancer in 1998.  Patient underwent lumpectomy and radiation in [place].  Denies being treated for any antiestrogen treatments.  She recalls that the breast cancer did not involve her axillary lymph node. Family history is positive for prostate cancer in father.  Paternal aunt had multiple myeloma.    INTERVAL HISTORY BETTYANN PLILER is a 67 y.o. female who has above history reviewed by me today presents for follow up visit for leukocytosis. Patient denies any  unintentional weight loss, night sweats, fever.  Review of Systems  Constitutional:  Negative for appetite change, chills, fatigue and fever.  HENT:   Negative for hearing loss and voice change.   Eyes:  Negative for eye problems.  Respiratory:  Negative for chest tightness and cough.   Cardiovascular:  Negative for chest pain.  Gastrointestinal:  Negative for abdominal distention, abdominal pain and blood in stool.  Endocrine: Negative for hot flashes.  Genitourinary:  Negative for difficulty urinating and frequency.   Musculoskeletal:  Negative for arthralgias.  Skin:  Negative for itching and rash.  Neurological:  Negative for extremity weakness.  Hematological:  Negative for adenopathy.  Psychiatric/Behavioral:  Negative for confusion.     MEDICAL HISTORY:  Past Medical History:  Diagnosis Date   Anxiety    Arthritis    Breast cancer ([place]) 1998   Right Breast   Depression    Diabetes mellitus without complication ([place])    GERD (gastroesophageal reflux disease)    Hypercholesteremia    Hypertension     SURGICAL HISTORY: Past Surgical History:  Procedure Laterality Date   ABDOMINAL HYSTERECTOMY     BREAST LUMPECTOMY Right 1998   CARPAL TUNNEL RELEASE Left    CESAREAN SECTION     x2   CHOLECYSTECTOMY      SOCIAL HISTORY: Social History   Socioeconomic History   Marital status: Married    Spouse name: Not on file   Number of children: Not on file   Years of education: Not on file   Highest education level: Not on file  Occupational History   Not on file  Tobacco Use   Smoking status: Never   Smokeless tobacco: Never  Vaping Use   Vaping Use: Never used  Substance and Sexual Activity   Alcohol use: Yes    Comment: less than 1 per week - liquor   Drug use: No   Sexual activity: Not on file  Other Topics Concern   Not on file  Social History Narrative   Not on file   Social Determinants of Health   Financial Resource Strain: Not on file  Food  Insecurity: Not on file  Transportation Needs: Not on file  Physical Activity: Not on file  Stress: Not on file  Social Connections: Not on file  Intimate Partner Violence: Not on file    FAMILY HISTORY: Family History  Problem Relation Age of Onset   Heart disease Mother    Hypertension Mother    Vascular Disease Mother    Macular degeneration Mother    COPD Mother    Prostate cancer Father    Heart disease Father    Multiple myeloma Paternal Aunt    Cancer Maternal Grandfather    Diabetes Paternal Aunt    Diabetes Paternal Aunt    Diabetes Paternal Aunt     ALLERGIES:  is allergic to codeine and latex.  MEDICATIONS:  Current Outpatient Medications  Medication Sig Dispense Refill   atorvastatin (LIPITOR) 40 MG tablet Take 40 mg by mouth daily.     buPROPion (WELLBUTRIN SR) 150 MG 12 hr tablet Take 150 mg by mouth daily.     losartan-hydrochlorothiazide (HYZAAR) 50-12.5 MG tablet Take 1 tablet by mouth daily.     metFORMIN (GLUCOPHAGE-XR) 500 MG 24 hr tablet Take 1,000 mg by mouth daily with breakfast.     Multiple Minerals-Vitamins (CAL MAG ZINC +D3 PO) Take by mouth daily.     omeprazole (PRILOSEC) 20 MG capsule Take 20 mg by mouth every 3 (three) days.      PARoxetine (PAXIL) 40 MG tablet Take 40 mg by mouth daily.     Semaglutide,0.25 or 0.'5MG'$ /DOS, 2 MG/3ML SOPN Inject 0.5 mg into the skin once a week.     hydrochlorothiazide (MICROZIDE) 12.5 MG capsule Take 12.5 mg by mouth daily. (Patient not taking: Reported on 02/28/2023)     naproxen sodium (ANAPROX) 220 MG tablet Take 220 mg by mouth daily as needed. (Patient not taking: Reported on 02/28/2023)     No current facility-administered medications for this visit.     PHYSICAL EXAMINATION: ECOG PERFORMANCE STATUS: 0 - Asymptomatic Vitals:   02/28/23 1224  BP: 139/76  Pulse: 63  Resp: 18  Temp: (!) 97.3 F (36.3 C)   Filed Weights   02/28/23 1224  Weight: 188 lb 12.8 oz (85.6 kg)    Physical  Exam Constitutional:      General: She is not in acute distress.    Appearance: She is obese.  HENT:     Head: Normocephalic and atraumatic.  Eyes:     General: No scleral icterus. Cardiovascular:     Rate and Rhythm: Normal rate and regular rhythm.     Heart sounds: Normal heart sounds.  Pulmonary:     Effort: Pulmonary effort is normal. No respiratory distress.     Breath sounds: No wheezing.  Abdominal:     General: Bowel sounds are normal. There is no distension.     Palpations: Abdomen is soft.  Musculoskeletal:        General: No deformity. Normal range of motion.     Cervical back: Normal range of motion and neck supple.  Skin:    General: Skin is warm and dry.  Findings: No erythema or rash.  Neurological:     Mental Status: She is alert and oriented to person, place, and time. Mental status is at baseline.     Cranial Nerves: No cranial nerve deficit.     Coordination: Coordination normal.  Psychiatric:        Mood and Affect: Mood normal.     LABORATORY DATA:  I have reviewed the data as listed    Latest Ref Rng & Units 02/28/2023   12:11 PM 02/27/2022   12:56 PM 01/30/2021   11:51 AM  CBC  WBC 4.0 - 10.5 K/uL 11.1  12.0  10.0   Hemoglobin 12.0 - 15.0 g/dL 14.1  14.1  13.4   Hematocrit 36.0 - 46.0 % 44.3  43.2  40.2   Platelets 150 - 400 K/uL 355  361  324       Latest Ref Rng & Units 02/28/2023   12:11 PM 01/30/2021   11:51 AM 10/26/2008    8:25 AM  CMP  Glucose 70 - 99 mg/dL 103  119  116   BUN 8 - 23 mg/dL '18  20  12   '$ Creatinine 0.44 - 1.00 mg/dL 0.77  0.65  0.70   Sodium 135 - 145 mmol/L 136  139  139   Potassium 3.5 - 5.1 mmol/L 4.0  4.6  3.9   Chloride 98 - 111 mmol/L 100  100  103   CO2 22 - 32 mmol/L '28  30  30   '$ Calcium 8.9 - 10.3 mg/dL 9.8  10.0  9.4   Total Protein 6.5 - 8.1 g/dL 7.6  7.0  6.6   Total Bilirubin 0.3 - 1.2 mg/dL 0.5  0.6  0.8   Alkaline Phos 38 - 126 U/L 81  65  70   AST 15 - 41 U/L 19  28  33   ALT 0 - 44 U/L 23  37  40         RADIOGRAPHIC STUDIES: I have personally reviewed the radiological images as listed and agreed with the findings in the report. No results found.

## 2023-02-28 NOTE — Progress Notes (Signed)
Pt here for follow up. No new concerns voiced.   

## 2023-04-28 ENCOUNTER — Ambulatory Visit: Payer: Medicare Other

## 2023-04-28 DIAGNOSIS — K640 First degree hemorrhoids: Secondary | ICD-10-CM | POA: Diagnosis not present

## 2023-04-28 DIAGNOSIS — D120 Benign neoplasm of cecum: Secondary | ICD-10-CM | POA: Diagnosis not present

## 2023-04-28 DIAGNOSIS — Z86010 Personal history of colonic polyps: Secondary | ICD-10-CM | POA: Diagnosis not present

## 2023-04-28 DIAGNOSIS — D124 Benign neoplasm of descending colon: Secondary | ICD-10-CM | POA: Diagnosis not present

## 2023-04-28 DIAGNOSIS — Z09 Encounter for follow-up examination after completed treatment for conditions other than malignant neoplasm: Secondary | ICD-10-CM | POA: Diagnosis present

## 2023-05-23 ENCOUNTER — Other Ambulatory Visit: Payer: Self-pay | Admitting: Family Medicine

## 2023-05-23 DIAGNOSIS — Z1231 Encounter for screening mammogram for malignant neoplasm of breast: Secondary | ICD-10-CM

## 2023-06-20 ENCOUNTER — Ambulatory Visit
Admission: RE | Admit: 2023-06-20 | Discharge: 2023-06-20 | Disposition: A | Discharge status: Home / Self Care | Payer: Medicare Other | Source: Ambulatory Visit | Attending: Family Medicine | Admitting: Family Medicine

## 2023-06-20 DIAGNOSIS — Z1231 Encounter for screening mammogram for malignant neoplasm of breast: Secondary | ICD-10-CM

## 2024-07-15 ENCOUNTER — Other Ambulatory Visit: Payer: Self-pay | Admitting: Family Medicine

## 2024-07-15 DIAGNOSIS — Z1231 Encounter for screening mammogram for malignant neoplasm of breast: Secondary | ICD-10-CM

## 2024-08-03 ENCOUNTER — Ambulatory Visit
Admission: RE | Admit: 2024-08-03 | Discharge: 2024-08-03 | Disposition: A | Discharge status: Home / Self Care | Source: Ambulatory Visit | Attending: Family Medicine | Admitting: Family Medicine

## 2024-08-03 DIAGNOSIS — Z1231 Encounter for screening mammogram for malignant neoplasm of breast: Secondary | ICD-10-CM

## 2024-12-14 ENCOUNTER — Other Ambulatory Visit: Payer: Self-pay | Admitting: Family Medicine

## 2024-12-14 ENCOUNTER — Encounter: Payer: Self-pay | Admitting: Family Medicine

## 2024-12-14 DIAGNOSIS — Z780 Asymptomatic menopausal state: Secondary | ICD-10-CM

## 2024-12-14 DIAGNOSIS — M8580 Other specified disorders of bone density and structure, unspecified site: Secondary | ICD-10-CM
